# Patient Record
Sex: Female | Born: 1950 | Race: White | Hispanic: No | Marital: Single
Health system: Southern US, Community
[De-identification: ages and names within clinical notes are randomized; demographics above are authoritative.]

## PROBLEM LIST (undated history)

## (undated) DIAGNOSIS — I10 Essential (primary) hypertension: Secondary | ICD-10-CM

## (undated) DIAGNOSIS — J9611 Chronic respiratory failure with hypoxia: Secondary | ICD-10-CM

## (undated) DIAGNOSIS — G4733 Obstructive sleep apnea (adult) (pediatric): Secondary | ICD-10-CM

## (undated) DIAGNOSIS — K279 Peptic ulcer, site unspecified, unspecified as acute or chronic, without hemorrhage or perforation: Secondary | ICD-10-CM

## (undated) DIAGNOSIS — Z7409 Other reduced mobility: Secondary | ICD-10-CM

## (undated) DIAGNOSIS — D126 Benign neoplasm of colon, unspecified: Secondary | ICD-10-CM

## (undated) DIAGNOSIS — J45909 Unspecified asthma, uncomplicated: Secondary | ICD-10-CM

## (undated) DIAGNOSIS — I639 Cerebral infarction, unspecified: Secondary | ICD-10-CM

## (undated) DIAGNOSIS — M5416 Radiculopathy, lumbar region: Secondary | ICD-10-CM

## (undated) DIAGNOSIS — G629 Polyneuropathy, unspecified: Secondary | ICD-10-CM

## (undated) DIAGNOSIS — F331 Major depressive disorder, recurrent, moderate: Secondary | ICD-10-CM

## (undated) DIAGNOSIS — I878 Other specified disorders of veins: Secondary | ICD-10-CM

## (undated) DIAGNOSIS — R0609 Other forms of dyspnea: Secondary | ICD-10-CM

## (undated) DIAGNOSIS — L4050 Arthropathic psoriasis, unspecified: Secondary | ICD-10-CM

## (undated) DIAGNOSIS — R9431 Abnormal electrocardiogram [ECG] [EKG]: Secondary | ICD-10-CM

## (undated) DIAGNOSIS — I2510 Atherosclerotic heart disease of native coronary artery without angina pectoris: Secondary | ICD-10-CM

## (undated) DIAGNOSIS — R319 Hematuria, unspecified: Secondary | ICD-10-CM

## (undated) DIAGNOSIS — M87059 Idiopathic aseptic necrosis of unspecified femur: Secondary | ICD-10-CM

## (undated) DIAGNOSIS — F411 Generalized anxiety disorder: Secondary | ICD-10-CM

## (undated) DIAGNOSIS — M797 Fibromyalgia: Secondary | ICD-10-CM

## (undated) DIAGNOSIS — K5790 Diverticulosis of intestine, part unspecified, without perforation or abscess without bleeding: Secondary | ICD-10-CM

## (undated) DIAGNOSIS — H409 Unspecified glaucoma: Secondary | ICD-10-CM

## (undated) DIAGNOSIS — R0600 Dyspnea, unspecified: Secondary | ICD-10-CM

## (undated) DIAGNOSIS — J449 Chronic obstructive pulmonary disease, unspecified: Secondary | ICD-10-CM

## (undated) DIAGNOSIS — E119 Type 2 diabetes mellitus without complications: Secondary | ICD-10-CM

## (undated) HISTORY — PX: EXPLORATORY LAPAROTOMY: SUR591

---

## 2017-09-04 HISTORY — PX: TOTAL KNEE ARTHROPLASTY: SHX125

## 2018-10-05 DIAGNOSIS — R042 Hemoptysis: Secondary | ICD-10-CM

## 2018-10-05 HISTORY — DX: Hemoptysis: R04.2

## 2019-05-06 DIAGNOSIS — U071 COVID-19: Secondary | ICD-10-CM

## 2019-05-06 DIAGNOSIS — A4189 Other specified sepsis: Secondary | ICD-10-CM

## 2019-05-06 HISTORY — DX: Other specified sepsis: A41.89

## 2019-05-06 HISTORY — DX: COVID-19: U07.1

## 2019-05-31 DIAGNOSIS — U071 COVID-19: Secondary | ICD-10-CM

## 2019-05-31 HISTORY — DX: COVID-19: U07.1

## 2019-07-03 ENCOUNTER — Inpatient Hospital Stay
Admission: RE | Admit: 2019-07-03 | Discharge: 2019-07-22 | Disposition: A | Discharge status: Rehabilitation Facility | Payer: Medicare Other | Source: Other Acute Inpatient Hospital | Attending: Internal Medicine | Admitting: Internal Medicine

## 2019-07-03 ENCOUNTER — Other Ambulatory Visit (HOSPITAL_COMMUNITY): Payer: Medicare Other

## 2019-07-03 DIAGNOSIS — D729 Disorder of white blood cells, unspecified: Secondary | ICD-10-CM

## 2019-07-03 DIAGNOSIS — T85598A Other mechanical complication of other gastrointestinal prosthetic devices, implants and grafts, initial encounter: Secondary | ICD-10-CM

## 2019-07-03 DIAGNOSIS — J9690 Respiratory failure, unspecified, unspecified whether with hypoxia or hypercapnia: Secondary | ICD-10-CM

## 2019-07-03 DIAGNOSIS — J189 Pneumonia, unspecified organism: Secondary | ICD-10-CM

## 2019-07-03 DIAGNOSIS — U071 COVID-19: Secondary | ICD-10-CM

## 2019-07-03 HISTORY — DX: Dyspnea, unspecified: R06.00

## 2019-07-03 HISTORY — DX: Obstructive sleep apnea (adult) (pediatric): G47.33

## 2019-07-03 HISTORY — DX: Fibromyalgia: M79.7

## 2019-07-03 HISTORY — DX: Cerebral infarction, unspecified: I63.9

## 2019-07-03 HISTORY — DX: Other reduced mobility: Z74.09

## 2019-07-03 HISTORY — DX: Major depressive disorder, recurrent, moderate: F33.1

## 2019-07-03 HISTORY — DX: Other specified disorders of veins: I87.8

## 2019-07-03 HISTORY — DX: Arthropathic psoriasis, unspecified: L40.50

## 2019-07-03 HISTORY — DX: Essential (primary) hypertension: I10

## 2019-07-03 HISTORY — DX: Abnormal electrocardiogram (ECG) (EKG): R94.31

## 2019-07-03 HISTORY — DX: Unspecified glaucoma: H40.9

## 2019-07-03 HISTORY — DX: Type 2 diabetes mellitus without complications: E11.9

## 2019-07-03 HISTORY — DX: Polyneuropathy, unspecified: G62.9

## 2019-07-03 HISTORY — DX: Unspecified asthma, uncomplicated: J45.909

## 2019-07-03 HISTORY — DX: Diverticulosis of intestine, part unspecified, without perforation or abscess without bleeding: K57.90

## 2019-07-03 HISTORY — DX: Generalized anxiety disorder: F41.1

## 2019-07-03 HISTORY — DX: Benign neoplasm of colon, unspecified: D12.6

## 2019-07-03 HISTORY — DX: Other forms of dyspnea: R06.09

## 2019-07-03 HISTORY — DX: Idiopathic aseptic necrosis of unspecified femur: M87.059

## 2019-07-03 HISTORY — DX: Hematuria, unspecified: R31.9

## 2019-07-03 HISTORY — DX: Peptic ulcer, site unspecified, unspecified as acute or chronic, without hemorrhage or perforation: K27.9

## 2019-07-03 HISTORY — DX: Atherosclerotic heart disease of native coronary artery without angina pectoris: I25.10

## 2019-07-03 HISTORY — DX: Chronic obstructive pulmonary disease, unspecified: J44.9

## 2019-07-03 HISTORY — DX: Chronic respiratory failure with hypoxia: J96.11

## 2019-07-03 HISTORY — DX: Radiculopathy, lumbar region: M54.16

## 2019-07-04 ENCOUNTER — Other Ambulatory Visit (HOSPITAL_COMMUNITY): Payer: Medicare Other

## 2019-07-04 LAB — CBC
HCT: 42.1 % (ref 36.0–46.0)
Hemoglobin: 12.4 g/dL (ref 12.0–15.0)
MCH: 29 pg (ref 26.0–34.0)
MCHC: 29.5 g/dL — ABNORMAL LOW (ref 30.0–36.0)
MCV: 98.4 fL (ref 80.0–100.0)
Platelets: 300 10*3/uL (ref 150–400)
RBC: 4.28 MIL/uL (ref 3.87–5.11)
RDW: 14.6 % (ref 11.5–15.5)
WBC: 12.2 10*3/uL — ABNORMAL HIGH (ref 4.0–10.5)
nRBC: 0 % (ref 0.0–0.2)

## 2019-07-04 LAB — COMPREHENSIVE METABOLIC PANEL
ALT: 54 U/L — ABNORMAL HIGH (ref 0–44)
AST: 27 U/L (ref 15–41)
Albumin: 2.4 g/dL — ABNORMAL LOW (ref 3.5–5.0)
Alkaline Phosphatase: 97 U/L (ref 38–126)
Anion gap: 12 (ref 5–15)
BUN: 22 mg/dL (ref 8–23)
CO2: 26 mmol/L (ref 22–32)
Calcium: 8.7 mg/dL — ABNORMAL LOW (ref 8.9–10.3)
Chloride: 110 mmol/L (ref 98–111)
Creatinine, Ser: 0.55 mg/dL (ref 0.44–1.00)
GFR calc Af Amer: >60 mL/min (ref >60)
GFR calc non Af Amer: >60 mL/min (ref >60)
Glucose, Bld: 192 mg/dL — ABNORMAL HIGH (ref 70–99)
Potassium: 3.6 mmol/L (ref 3.5–5.1)
Sodium: 148 mmol/L — ABNORMAL HIGH (ref 135–145)
Total Bilirubin: 0.2 mg/dL — ABNORMAL LOW (ref 0.3–1.2)
Total Protein: 6.8 g/dL (ref 6.5–8.1)

## 2019-07-04 LAB — PROTIME-INR
INR: 1.1 (ref 0.8–1.2)
Prothrombin Time: 14.5 seconds (ref 11.4–15.2)

## 2019-07-04 LAB — APTT: aPTT: 28 seconds (ref 24–36)

## 2019-07-04 MED ORDER — GUAIFENESIN 100 MG/5ML PO LIQD
200.00 | ORAL | Status: DC
Start: ? — End: 2019-07-04

## 2019-07-04 MED ORDER — BENZONATATE 100 MG PO CAPS
100.00 | ORAL_CAPSULE | ORAL | Status: DC
Start: ? — End: 2019-07-04

## 2019-07-04 MED ORDER — DEXTROSE 10 % IV SOLN
125.00 | INTRAVENOUS | Status: DC
Start: ? — End: 2019-07-04

## 2019-07-04 MED ORDER — OXYCODONE HCL 5 MG PO TABS
5.00 | ORAL_TABLET | ORAL | Status: DC
Start: 2019-07-04 — End: 2019-07-04

## 2019-07-04 MED ORDER — GENERIC EXTERNAL MEDICATION
40.00 | Status: DC
Start: 2019-07-04 — End: 2019-07-04

## 2019-07-04 MED ORDER — ACETAMINOPHEN 325 MG PO TABS
650.00 | ORAL_TABLET | ORAL | Status: DC
Start: ? — End: 2019-07-04

## 2019-07-04 MED ORDER — PRAVASTATIN SODIUM 10 MG PO TABS
20.00 | ORAL_TABLET | ORAL | Status: DC
Start: 2019-07-04 — End: 2019-07-04

## 2019-07-04 MED ORDER — GENERIC EXTERNAL MEDICATION
2.00 | Status: DC
Start: 2019-07-04 — End: 2019-07-04

## 2019-07-04 MED ORDER — INSULIN LISPRO 100 UNIT/ML ~~LOC~~ SOLN
2.00 | SUBCUTANEOUS | Status: DC
Start: 2019-07-04 — End: 2019-07-04

## 2019-07-04 MED ORDER — ALBUTEROL SULFATE HFA 108 (90 BASE) MCG/ACT IN AERS
2.00 | INHALATION_SPRAY | RESPIRATORY_TRACT | Status: DC
Start: ? — End: 2019-07-04

## 2019-07-04 MED ORDER — OXYCODONE HCL 5 MG PO TABS
10.00 | ORAL_TABLET | ORAL | Status: DC
Start: ? — End: 2019-07-04

## 2019-07-04 MED ORDER — GABAPENTIN 300 MG PO CAPS
300.00 | ORAL_CAPSULE | ORAL | Status: DC
Start: 2019-07-04 — End: 2019-07-04

## 2019-07-04 MED ORDER — GENERIC EXTERNAL MEDICATION
Status: DC
Start: 2019-07-04 — End: 2019-07-04

## 2019-07-04 MED ORDER — ALPRAZOLAM 0.25 MG PO TABS
0.25 | ORAL_TABLET | ORAL | Status: DC
Start: 2019-07-04 — End: 2019-07-04

## 2019-07-04 MED ORDER — GENERIC EXTERNAL MEDICATION
Status: DC
Start: ? — End: 2019-07-04

## 2019-07-04 MED ORDER — ENOXAPARIN SODIUM 60 MG/0.6ML ~~LOC~~ SOLN
60.00 | SUBCUTANEOUS | Status: DC
Start: 2019-07-04 — End: 2019-07-04

## 2019-07-04 MED ORDER — SALINE NASAL SPRAY 0.65 % NA SOLN
1.00 | NASAL | Status: DC
Start: ? — End: 2019-07-04

## 2019-07-04 MED ORDER — IPRATROPIUM-ALBUTEROL 0.5-2.5 (3) MG/3ML IN SOLN
3.00 | RESPIRATORY_TRACT | Status: DC
Start: 2019-07-04 — End: 2019-07-04

## 2019-07-04 MED ORDER — OXYCODONE HCL 5 MG PO TABS
5.00 | ORAL_TABLET | ORAL | Status: DC
Start: ? — End: 2019-07-04

## 2019-07-04 MED ORDER — GENERIC EXTERNAL MEDICATION
625.00 | Status: DC
Start: 2019-07-04 — End: 2019-07-04

## 2019-07-04 MED ORDER — INSULIN GLARGINE 100 UNIT/ML SOLOSTAR PEN
30.00 | PEN_INJECTOR | SUBCUTANEOUS | Status: DC
Start: 2019-07-04 — End: 2019-07-04

## 2019-07-04 MED ORDER — NYSTATIN 100000 UNIT/GM EX POWD
CUTANEOUS | Status: DC
Start: 2019-07-04 — End: 2019-07-04

## 2019-07-04 MED ORDER — LORAZEPAM 2 MG/ML IJ SOLN
0.50 | INTRAMUSCULAR | Status: DC
Start: ? — End: 2019-07-04

## 2019-07-04 MED ORDER — GLUCOSE 40 % PO GEL
15.00 | ORAL | Status: DC
Start: ? — End: 2019-07-04

## 2019-07-04 MED ORDER — QUETIAPINE FUMARATE 25 MG PO TABS
50.00 | ORAL_TABLET | ORAL | Status: DC
Start: ? — End: 2019-07-04

## 2019-07-04 MED ORDER — LOPERAMIDE HCL 2 MG PO CAPS
2.00 | ORAL_CAPSULE | ORAL | Status: DC
Start: ? — End: 2019-07-04

## 2019-07-05 ENCOUNTER — Other Ambulatory Visit (HOSPITAL_COMMUNITY): Payer: Medicare Other

## 2019-07-06 LAB — BASIC METABOLIC PANEL
Anion gap: 12 (ref 5–15)
BUN: 23 mg/dL (ref 8–23)
CO2: 29 mmol/L (ref 22–32)
Calcium: 9.1 mg/dL (ref 8.9–10.3)
Chloride: 110 mmol/L (ref 98–111)
Creatinine, Ser: 0.59 mg/dL (ref 0.44–1.00)
GFR calc Af Amer: >60 mL/min (ref >60)
GFR calc non Af Amer: >60 mL/min (ref >60)
Glucose, Bld: 230 mg/dL — ABNORMAL HIGH (ref 70–99)
Potassium: 3.8 mmol/L (ref 3.5–5.1)
Sodium: 151 mmol/L — ABNORMAL HIGH (ref 135–145)

## 2019-07-06 LAB — CBC
HCT: 44.8 % (ref 36.0–46.0)
Hemoglobin: 13.2 g/dL (ref 12.0–15.0)
MCH: 28.6 pg (ref 26.0–34.0)
MCHC: 29.5 g/dL — ABNORMAL LOW (ref 30.0–36.0)
MCV: 97.2 fL (ref 80.0–100.0)
Platelets: 303 10*3/uL (ref 150–400)
RBC: 4.61 MIL/uL (ref 3.87–5.11)
RDW: 14.3 % (ref 11.5–15.5)
WBC: 14.1 10*3/uL — ABNORMAL HIGH (ref 4.0–10.5)
nRBC: 0 % (ref 0.0–0.2)

## 2019-07-08 LAB — URINALYSIS, ROUTINE W REFLEX MICROSCOPIC
Bilirubin Urine: NEGATIVE
Glucose, UA: NEGATIVE mg/dL
Hgb urine dipstick: NEGATIVE
Ketones, ur: NEGATIVE mg/dL
Nitrite: NEGATIVE
Protein, ur: NEGATIVE mg/dL
Specific Gravity, Urine: 1.021 (ref 1.005–1.030)
pH: 5 (ref 5.0–8.0)

## 2019-07-08 LAB — CBC
HCT: 44.3 % (ref 36.0–46.0)
Hemoglobin: 13.1 g/dL (ref 12.0–15.0)
MCH: 28.7 pg (ref 26.0–34.0)
MCHC: 29.6 g/dL — ABNORMAL LOW (ref 30.0–36.0)
MCV: 96.9 fL (ref 80.0–100.0)
Platelets: 240 10*3/uL (ref 150–400)
RBC: 4.57 MIL/uL (ref 3.87–5.11)
RDW: 14.3 % (ref 11.5–15.5)
WBC: 17.1 10*3/uL — ABNORMAL HIGH (ref 4.0–10.5)
nRBC: 0 % (ref 0.0–0.2)

## 2019-07-08 LAB — BASIC METABOLIC PANEL
Anion gap: 10 (ref 5–15)
BUN: 30 mg/dL — ABNORMAL HIGH (ref 8–23)
CO2: 27 mmol/L (ref 22–32)
Calcium: 9 mg/dL (ref 8.9–10.3)
Chloride: 111 mmol/L (ref 98–111)
Creatinine, Ser: 0.65 mg/dL (ref 0.44–1.00)
GFR calc Af Amer: >60 mL/min (ref >60)
GFR calc non Af Amer: >60 mL/min (ref >60)
Glucose, Bld: 215 mg/dL — ABNORMAL HIGH (ref 70–99)
Potassium: 4.3 mmol/L (ref 3.5–5.1)
Sodium: 148 mmol/L — ABNORMAL HIGH (ref 135–145)

## 2019-07-09 LAB — URINE CULTURE

## 2019-07-09 LAB — BASIC METABOLIC PANEL
Anion gap: 12 (ref 5–15)
BUN: 27 mg/dL — ABNORMAL HIGH (ref 8–23)
CO2: 25 mmol/L (ref 22–32)
Calcium: 8.5 mg/dL — ABNORMAL LOW (ref 8.9–10.3)
Chloride: 103 mmol/L (ref 98–111)
Creatinine, Ser: 0.52 mg/dL (ref 0.44–1.00)
GFR calc Af Amer: >60 mL/min (ref >60)
GFR calc non Af Amer: >60 mL/min (ref >60)
Glucose, Bld: 147 mg/dL — ABNORMAL HIGH (ref 70–99)
Potassium: 4.2 mmol/L (ref 3.5–5.1)
Sodium: 140 mmol/L (ref 135–145)

## 2019-07-09 LAB — CBC
HCT: 41.8 % (ref 36.0–46.0)
Hemoglobin: 12.7 g/dL (ref 12.0–15.0)
MCH: 28.6 pg (ref 26.0–34.0)
MCHC: 30.4 g/dL (ref 30.0–36.0)
MCV: 94.1 fL (ref 80.0–100.0)
Platelets: 199 10*3/uL (ref 150–400)
RBC: 4.44 MIL/uL (ref 3.87–5.11)
RDW: 13.7 % (ref 11.5–15.5)
WBC: 20.4 10*3/uL — ABNORMAL HIGH (ref 4.0–10.5)
nRBC: 0 % (ref 0.0–0.2)

## 2019-07-10 LAB — CULTURE, RESPIRATORY W GRAM STAIN: Culture: NORMAL

## 2019-07-10 LAB — VANCOMYCIN, TROUGH: Vancomycin Tr: 12 ug/mL — ABNORMAL LOW (ref 15–20)

## 2019-07-11 ENCOUNTER — Other Ambulatory Visit (HOSPITAL_COMMUNITY): Payer: Medicare Other

## 2019-07-11 LAB — VANCOMYCIN, TROUGH: Vancomycin Tr: 21 ug/mL (ref 15–20)

## 2019-07-13 LAB — BASIC METABOLIC PANEL
Anion gap: 11 (ref 5–15)
BUN: 18 mg/dL (ref 8–23)
CO2: 24 mmol/L (ref 22–32)
Calcium: 8.5 mg/dL — ABNORMAL LOW (ref 8.9–10.3)
Chloride: 103 mmol/L (ref 98–111)
Creatinine, Ser: 0.44 mg/dL (ref 0.44–1.00)
GFR calc Af Amer: >60 mL/min (ref >60)
GFR calc non Af Amer: >60 mL/min (ref >60)
Glucose, Bld: 205 mg/dL — ABNORMAL HIGH (ref 70–99)
Potassium: 3.6 mmol/L (ref 3.5–5.1)
Sodium: 138 mmol/L (ref 135–145)

## 2019-07-13 LAB — VANCOMYCIN, TROUGH: Vancomycin Tr: 19 ug/mL (ref 15–20)

## 2019-07-17 ENCOUNTER — Other Ambulatory Visit (HOSPITAL_COMMUNITY): Payer: Medicare Other

## 2019-07-17 LAB — CBC
HCT: 39.4 % (ref 36.0–46.0)
Hemoglobin: 12.6 g/dL (ref 12.0–15.0)
MCH: 28.7 pg (ref 26.0–34.0)
MCHC: 32 g/dL (ref 30.0–36.0)
MCV: 89.7 fL (ref 80.0–100.0)
Platelets: 168 10*3/uL (ref 150–400)
RBC: 4.39 MIL/uL (ref 3.87–5.11)
RDW: 14.6 % (ref 11.5–15.5)
WBC: 21.3 10*3/uL — ABNORMAL HIGH (ref 4.0–10.5)
nRBC: 0 % (ref 0.0–0.2)

## 2019-07-17 LAB — BASIC METABOLIC PANEL
Anion gap: 14 (ref 5–15)
BUN: 14 mg/dL (ref 8–23)
CO2: 24 mmol/L (ref 22–32)
Calcium: 8.6 mg/dL — ABNORMAL LOW (ref 8.9–10.3)
Chloride: 101 mmol/L (ref 98–111)
Creatinine, Ser: 0.58 mg/dL (ref 0.44–1.00)
GFR calc Af Amer: >60 mL/min (ref >60)
GFR calc non Af Amer: >60 mL/min (ref >60)
Glucose, Bld: 245 mg/dL — ABNORMAL HIGH (ref 70–99)
Potassium: 3.7 mmol/L (ref 3.5–5.1)
Sodium: 139 mmol/L (ref 135–145)

## 2019-07-17 NOTE — Progress Notes (Signed)
  Echocardiogram 2D Echocardiogram has been performed.  Vanessa Mack 07/17/2019, 9:52 AM

## 2019-07-18 LAB — BASIC METABOLIC PANEL
Anion gap: 14 (ref 5–15)
BUN: 9 mg/dL (ref 8–23)
CO2: 24 mmol/L (ref 22–32)
Calcium: 8.1 mg/dL — ABNORMAL LOW (ref 8.9–10.3)
Chloride: 104 mmol/L (ref 98–111)
Creatinine, Ser: 0.4 mg/dL — ABNORMAL LOW (ref 0.44–1.00)
GFR calc Af Amer: >60 mL/min (ref >60)
GFR calc non Af Amer: >60 mL/min (ref >60)
Glucose, Bld: 117 mg/dL — ABNORMAL HIGH (ref 70–99)
Potassium: 2.8 mmol/L — ABNORMAL LOW (ref 3.5–5.1)
Sodium: 142 mmol/L (ref 135–145)

## 2019-07-18 LAB — CBC
HCT: 38.6 % (ref 36.0–46.0)
Hemoglobin: 12.4 g/dL (ref 12.0–15.0)
MCH: 29 pg (ref 26.0–34.0)
MCHC: 32.1 g/dL (ref 30.0–36.0)
MCV: 90.4 fL (ref 80.0–100.0)
Platelets: 150 10*3/uL (ref 150–400)
RBC: 4.27 MIL/uL (ref 3.87–5.11)
RDW: 14.8 % (ref 11.5–15.5)
WBC: 18.5 10*3/uL — ABNORMAL HIGH (ref 4.0–10.5)
nRBC: 0 % (ref 0.0–0.2)

## 2019-07-19 ENCOUNTER — Encounter: Payer: Self-pay | Admitting: Occupational Therapy

## 2019-07-19 LAB — POTASSIUM: Potassium: 3.8 mmol/L (ref 3.5–5.1)

## 2019-07-19 NOTE — PMR Pre-admission (Signed)
PMR Admission Coordinator Pre-Admission Assessment  Patient: Vanessa Mack is an 68 y.o., female MRN: [id number] DOB: 1951-06-04 Height: _0  (1.702 m) Weight: 96.2 kg   Insurance Information HMO:     PPO:      PCP:      IPA:      80/20: yes     OTHER:   PRIMARY: Medicare Part A and B      Policy#: [id number]      Subscriber: Patient CM Name:       Phone#:      Fax#:  Pre-Cert#:       Employer:  Benefits:  Phone #: online     Name: verified eligibility via [place] on 07/22/2019 Eff. Date: Part A and B effective 06/04/2008     Deduct: $1,408      Out of Pocket Max: NA      Life Max: NA CIR: Covered per Medicare guidelines once yearly deductible has been met.      SNF: days 1-20, 100%, days 21-100, 80% Outpatient: 80%     Co-Pay: 20% Home Health: 100%      Co-Pay:  DME: 80%     Co-Pay: 20% Providers: Pt's choice  *Pt is a QMB **According to [facility], on day of admit to CIR (11/17) pt will have 8 full Medicare days remaining. Anticipate pt will begin using her co-pay days while in IP Rehab. Pt and family aware that she will be using her co-pay days ($352/day once full days are used up).   SECONDARY: Medicaid [place]      Policy#: [id number]      Subscriber: Patient Coverage Code: [id number] CM Name:       Phone#:      Fax#:  Pre-Cert#:       Employer:  Benefits:  Phone #: [phone]     Name: automated system Eff. Date: verified eligibility on 07/22/2019     Deduct:       Out of Pocket Max:       Life Max:  CIR:       SNF:  Outpatient:      Co-Pay:  Home Health:       Co-Pay:  DME:      Co-Pay:   Medicaid Application Date:       Case Manager:  Disability Application Date:       Case Worker:   The "Data Collection Information Summary" for patients in Inpatient Rehabilitation Facilities with attached "Privacy Act [place] Records" was provided and verbally reviewed with: Patient  Emergency Contact Information Contact Information    Name Relation Home  Work [place], Vanessa Mack Son [phone]     *[name] (primary contact) Son [phone]        Current Medical History  Patient Admitting Diagnosis: Debility after Covid-19 respiratory failure  History of Present Illness: Pt is a 68 yo Female with a past medical history significant for diabetes, CAD, obstructive sleep apnea, hyperlipidemia, CVA, and COPD. Pt uses 2L [place] at home. Pt initially presented to [facility] after 1 week of malaise, DOE/SOB, and cough. Pt had recently been exposed to B and E from her [facility] aide who was positive for the virus. At [facility], pt required 6L [place] to maintain sats, was febrile, hypotensive, and tachycardic. Pt was admitted to the ICU, found to be COVID-19 positive and transferred to to [facility] on 9/27 for further management. Pt developed worsening hypoxia from COVID PNA and  required intubation on 9/29. Pt completed a 10 day course of remdesivir/dexamethasone and intermittently diuresed. Pt required increased vent support due to desaturations and required a 10 day course of vancoymycin/cefepime. Pt was given a poor prognosis and goals of care discussion occurred with family. Thankfully, pt's vent requirements gradually improved and pt was able to be extubated on 10/26. She was able to be weaned to 2L and sent to [facility] on rocephin due to E coli HAP. At [facility], pt progressed to room air and diet was able to be upgraded to carb modified, thin liquids. Pt's confusion has improved but remains with significant weakness at this time. Pt has been assessed by therapies with recommendation for CIR due to debility. Pt is to admit to CIR on 07/22/2019.   Patient's medical record from [facility] has been reviewed by the rehabilitation admission coordinator and physician.  Past Medical History  Past Medical History:  Diagnosis Date  . Aseptic necrosis of femur ([place])   . Chronic respiratory failure with hypoxia ([place])   .  Colon adenomas   . COPD (chronic obstructive pulmonary disease) ([place])   . Cough with hemoptysis 10/2018  . COVID-19 05/31/2019  . Diverticulosis   . DOE (dyspnea on exertion)   . Fibromyalgia   . GAD (generalized anxiety disorder)   . Glaucoma   . Hematuria   . HTN (hypertension)   . Impaired functional mobility, balance, gait, and endurance   . Intrinsic asthma   . Lumbar radiculopathy   . Major depressive disorder, recurrent episode, moderate ([place])   . Nonobstructive atherosclerosis of coronary artery   . OSA (obstructive sleep apnea)   . Polyneuropathy   . Prolonged Q-T interval on ECG   . Psoriatic arthritis ([place])   . PUD (peptic ulcer disease)   . Sepsis due to COVID-19 ([place]) 05/2019  . Stroke (cerebrum) ([place])   . T2DM (type 2 diabetes mellitus) ([place])   . Venous stasis of lower extremity     Family History   family history includes Clotting disorder in her mother; Diabetes in her mother; Psoriasis in her father; Skin cancer in her mother.  Prior Rehab/Hospitalizations Has the patient had prior rehab or hospitalizations prior to admission? Yes  Has the patient had major surgery during 100 days prior to admission? No   Current Medications No current outpatient medications on file.   See MAR   Patients Current Diet:  Diet Order    None      Precautions / Restrictions Monitor HR, Spo2 with activity      Has the patient had 2 or more falls or a fall with injury in the past year? No  Prior Activity Level Pt reports independent PTA but has an aide M-F that would assist with cooking, cleaning, and sometimes supervise her ADLs when she needed it. Pt utilized a 4pt cane for ambulation and was Independent in her medication management.     Prior Functional Level Self Care: Did the patient need help bathing, dressing, using the toilet or eating? Independent  Indoor Mobility: Did the patient need assistance with walking from room to room (with or without device)?  Independent  Stairs: Did the patient need assistance with internal or external stairs (with or without device)? Needed some help  Functional Cognition: Did the patient need help planning regular tasks such as shopping or remembering to take medications? Independent  Home Assistive Devices / Equipment    Prior Device Use: Indicate devices/aids used by the patient  prior to current illness, exacerbation or injury? 4 pronged cane   Prior Functional Level Current Functional Level  Bed Mobility  Independent  Min A   Transfers  Mod Independent   Mod A sit to stand   Mobility - Walk/Wheelchair  Mod Independent   NT   Upper Body Dressing  Independent   Min A   Lower Body Dressing  Independent   Dependent   Grooming  Independent   set up A   Eating/Drinking  Independent   set up A   Toilet Transfer  Independent   NT   Bladder Continence   continent   continent   Bowel Management  continent   continent   Stair Climbing      Dependent   Communication  WNL   WNL   Memory  WNL  WNL per this AC assessment   Driving        Special needs/care consideration BiPAP/CPAP : has used CPAP in past but has been without it >1 yr now at this time.  CPM : no Continuous Drip IV : none Dialysis : no        Days : no Life Vest : no Oxygen : on RA Special Bed : air overlay  Trach Size : no Wound Vac (area) : no      Location : no Skin: Per chart: unstageable sacrococcyx pressure injury, unstagable L ear abrasion, right heel stage 1, right and left anterior foot hyperlaurotic skin rash, anterior chest abdominal abrasions.             Bowel mgmt: last BM 11/15 Bladder mgmt: continent Diabetic mgmt: yes Behavioral consideration : no Chemo/radiation : pt reports being on medication for psoriasis that is a maintenance medication    Previous Home Environment  Pt lived alone in an apartment. She lives on the ground floor with level entry. She used a walk in shower that had  a seat, an elevated commode, and a bathroom that was accessible via RW. Pt has rails throughout her apartment and emergency call buttons. Pt utlized supplemental O2 at home (2-3 L day/night).  Pt has an aide who provided supervision during ADLs at times when she was weak as well as IADL support (cooking, cleaning). Aide was utilized M-F 3 hr/day.     Discharge Living Setting Plan is to DC to her son's house Vanessa Mack). He lives in a one story home with 3 steps to enter with bilateral rails. Pt has options of using tub/shower or walk in shower with standard commode and a bathroom that is assessable vis RW.  Pt does not have trouble obtaining medications at DC as her son's drive.     Social/Family/Support Systems Pt has very supportive sons (primary contact: Vanessa Mack [phone]) (secondary contact: Vanessa Mack: [phone]) Plan is to DC to Vanessa Mack's house where he is available 24/7 and can do up to Min/Mod A; he works from home.  Discharge plan has been discussed with pt and Vanessa Mack and Training and development officer (both sons) are in agreement with the plan.  Caregiver does not have issues with lodging/transportation while pt is in rehab.     Goals/Additional Needs Patient/family Goal for Rehab: PT/OT: Supervision; SLP: NA Expected length of stay: 12-16 days Cultural considerations: NA Dietary Needs: carb modified, regular diet, thin liquids Equipment needs: TBD Pt and family agree to admission and willing to participate: yes Program orientation provided and reviewed with pt and her sons Barriers to DC: home environment (Stairs to enter son's house). Cost  of care-pt will most likely enter her Medicare co-payment days.      Decrease burden of Care through IP rehab admission: NA  Possible need for SNF placement upon discharge: Not anticipated; pt and her family are willing and able to assist her at home after IP rehab. Pt has good prognosis for further progress through IP rehab. Anticipate pt will be able to  make measurable gains in a reasonable amount of time through CIR without need for SNF.   Patient Condition: I have reviewed medical records from [facility], spoken with CM, and patient and son. I met with patient at the bedside for inpatient rehabilitation assessment.  Patient will benefit from ongoing PT and OT, can actively participate in 3 hours of therapy a day 5 days of the week, and can make measurable gains during the admission.  Patient will also benefit from the coordinated team approach during an Inpatient Acute Rehabilitation admission.  The patient will receive intensive therapy as well as Rehabilitation physician, nursing, social worker, and care management interventions.  Due to safety, skin/wound care, disease management, medication administration, pain management and patient education the patient requires 24 hour a day rehabilitation nursing.  The patient is currently Mod A to stand, no gait attempted at this time and Min A to dependent for basic ADLs.  Discharge setting and therapy post discharge at home with home health is anticipated.  Patient has agreed to participate in the Acute Inpatient Rehabilitation Program and will admit 07/22/2019.  Preadmission Screen Completed By:  Raechel Ache, 07/22/2019 12:24 PM ______________________________________________________________________   Discussed status with Dr. Naaman Plummer on 07/22/2019 at 12:17PM and received approval for admission today.  Admission Coordinator:  Raechel Ache, OT, time 12:21PM/Date 07/22/2019   Assessment/Plan: Diagnosis: debility after COVID respiratory failure 1. Does the need for close, 24 hr/day Medical supervision in concert with the patient's rehab needs make it unreasonable for this patient to be served in a less intensive setting? Yes 2. Co-Morbidities requiring supervision/potential complications: DM, CAD, OSA, COPD, prior CVA 3. Due to bladder management, bowel management, safety, skin/wound care,  disease management, medication administration, pain management and patient education, does the patient require 24 hr/day rehab nursing? Yes 4. Does the patient require coordinated care of a physician, rehab nurse, PT, OT, and SLP to address physical and functional deficits in the context of the above medical diagnosis(es)? Yes Addressing deficits in the following areas: balance, endurance, locomotion, strength, transferring, bowel/bladder control, bathing, dressing, feeding, grooming, toileting and psychosocial support 5. Can the patient actively participate in an intensive therapy program of at least 3 hrs of therapy 5 days a week? Yes 6. The potential for patient to make measurable gains while on inpatient rehab is excellent 7. Anticipated functional outcomes upon discharge from inpatient rehab: supervision PT, supervision OT, n/a SLP 8. Estimated rehab length of stay to reach the above functional goals is: 13-17 days 9. Anticipated discharge destination: Home 10. Overall Rehab/Functional Prognosis: excellent  MD Signature Meredith Staggers, MD, [facility] 07/22/2019

## 2019-07-21 ENCOUNTER — Encounter: Payer: Self-pay | Admitting: Physical Medicine and Rehabilitation

## 2019-07-21 ENCOUNTER — Other Ambulatory Visit (HOSPITAL_COMMUNITY): Payer: Medicare Other

## 2019-07-22 ENCOUNTER — Other Ambulatory Visit: Payer: Self-pay

## 2019-07-22 ENCOUNTER — Encounter (HOSPITAL_COMMUNITY): Payer: Self-pay

## 2019-07-22 ENCOUNTER — Inpatient Hospital Stay (HOSPITAL_COMMUNITY)
Admission: RE | Admit: 2019-07-22 | Discharge: 2019-08-15 | DRG: 868 | Disposition: A | Discharge status: Home Health | Payer: Medicare Other | Source: Other Acute Inpatient Hospital | Attending: Physical Medicine & Rehabilitation | Admitting: Physical Medicine & Rehabilitation

## 2019-07-22 DIAGNOSIS — M797 Fibromyalgia: Secondary | ICD-10-CM | POA: Diagnosis present

## 2019-07-22 DIAGNOSIS — R5381 Other malaise: Secondary | ICD-10-CM | POA: Diagnosis present

## 2019-07-22 DIAGNOSIS — J449 Chronic obstructive pulmonary disease, unspecified: Secondary | ICD-10-CM | POA: Diagnosis present

## 2019-07-22 DIAGNOSIS — K629 Disease of anus and rectum, unspecified: Secondary | ICD-10-CM | POA: Diagnosis present

## 2019-07-22 DIAGNOSIS — I10 Essential (primary) hypertension: Secondary | ICD-10-CM | POA: Diagnosis present

## 2019-07-22 DIAGNOSIS — Z8673 Personal history of transient ischemic attack (TIA), and cerebral infarction without residual deficits: Secondary | ICD-10-CM

## 2019-07-22 DIAGNOSIS — L4050 Arthropathic psoriasis, unspecified: Secondary | ICD-10-CM | POA: Diagnosis present

## 2019-07-22 DIAGNOSIS — E1140 Type 2 diabetes mellitus with diabetic neuropathy, unspecified: Secondary | ICD-10-CM | POA: Diagnosis present

## 2019-07-22 DIAGNOSIS — R110 Nausea: Secondary | ICD-10-CM | POA: Diagnosis present

## 2019-07-22 DIAGNOSIS — I2510 Atherosclerotic heart disease of native coronary artery without angina pectoris: Secondary | ICD-10-CM | POA: Diagnosis present

## 2019-07-22 DIAGNOSIS — E119 Type 2 diabetes mellitus without complications: Secondary | ICD-10-CM

## 2019-07-22 DIAGNOSIS — Z886 Allergy status to analgesic agent status: Secondary | ICD-10-CM

## 2019-07-22 DIAGNOSIS — G4700 Insomnia, unspecified: Secondary | ICD-10-CM | POA: Diagnosis present

## 2019-07-22 DIAGNOSIS — Z833 Family history of diabetes mellitus: Secondary | ICD-10-CM | POA: Diagnosis not present

## 2019-07-22 DIAGNOSIS — F411 Generalized anxiety disorder: Secondary | ICD-10-CM | POA: Diagnosis present

## 2019-07-22 DIAGNOSIS — E1169 Type 2 diabetes mellitus with other specified complication: Secondary | ICD-10-CM | POA: Diagnosis not present

## 2019-07-22 DIAGNOSIS — M19012 Primary osteoarthritis, left shoulder: Secondary | ICD-10-CM | POA: Diagnosis present

## 2019-07-22 DIAGNOSIS — E669 Obesity, unspecified: Secondary | ICD-10-CM | POA: Diagnosis not present

## 2019-07-22 DIAGNOSIS — J9611 Chronic respiratory failure with hypoxia: Secondary | ICD-10-CM | POA: Diagnosis present

## 2019-07-22 DIAGNOSIS — M7502 Adhesive capsulitis of left shoulder: Secondary | ICD-10-CM | POA: Diagnosis present

## 2019-07-22 DIAGNOSIS — G4733 Obstructive sleep apnea (adult) (pediatric): Secondary | ICD-10-CM | POA: Diagnosis present

## 2019-07-22 DIAGNOSIS — E876 Hypokalemia: Secondary | ICD-10-CM | POA: Diagnosis present

## 2019-07-22 DIAGNOSIS — B948 Sequelae of other specified infectious and parasitic diseases: Principal | ICD-10-CM

## 2019-07-22 DIAGNOSIS — Z23 Encounter for immunization: Secondary | ICD-10-CM | POA: Diagnosis present

## 2019-07-22 DIAGNOSIS — Z9989 Dependence on other enabling machines and devices: Secondary | ICD-10-CM

## 2019-07-22 DIAGNOSIS — D72829 Elevated white blood cell count, unspecified: Secondary | ICD-10-CM | POA: Diagnosis present

## 2019-07-22 DIAGNOSIS — Z96651 Presence of right artificial knee joint: Secondary | ICD-10-CM | POA: Diagnosis present

## 2019-07-22 DIAGNOSIS — E785 Hyperlipidemia, unspecified: Secondary | ICD-10-CM | POA: Diagnosis present

## 2019-07-22 DIAGNOSIS — Z5320 Procedure and treatment not carried out because of patient's decision for unspecified reasons: Secondary | ICD-10-CM | POA: Diagnosis not present

## 2019-07-22 DIAGNOSIS — T380X5A Adverse effect of glucocorticoids and synthetic analogues, initial encounter: Secondary | ICD-10-CM | POA: Diagnosis present

## 2019-07-22 DIAGNOSIS — U071 COVID-19: Secondary | ICD-10-CM

## 2019-07-22 DIAGNOSIS — G894 Chronic pain syndrome: Secondary | ICD-10-CM | POA: Diagnosis present

## 2019-07-22 DIAGNOSIS — G479 Sleep disorder, unspecified: Secondary | ICD-10-CM | POA: Diagnosis not present

## 2019-07-22 LAB — NOVEL CORONAVIRUS, NAA (HOSP ORDER, SEND-OUT TO REF LAB; TAT 18-24 HRS): SARS-CoV-2, NAA: NOT DETECTED

## 2019-07-22 LAB — HEMOGLOBIN A1C
Hgb A1c MFr Bld: 6.7 % — ABNORMAL HIGH (ref 4.8–5.6)
Mean Plasma Glucose: 145.59 mg/dL

## 2019-07-22 LAB — GLUCOSE, CAPILLARY
Glucose-Capillary: 207 mg/dL — ABNORMAL HIGH (ref 70–99)
Glucose-Capillary: 147 mg/dL — ABNORMAL HIGH (ref 70–99)

## 2019-07-22 MED ORDER — BISACODYL 10 MG RE SUPP: 10.0000 mg | Freq: Every day | RECTAL | Status: DC | PRN

## 2019-07-22 MED ORDER — LIDOCAINE 5 % EX PTCH: 1.0000 | MEDICATED_PATCH | CUTANEOUS | Status: DC

## 2019-07-22 MED ORDER — PREDNISONE 10 MG (21) PO TBPK
20.0000 mg | ORAL_TABLET | Freq: Every evening | ORAL | Status: AC
  Administered 2019-07-24: 20 mg via ORAL

## 2019-07-22 MED ORDER — PREDNISONE 10 MG PO TABS: 20.0000 mg | ORAL_TABLET | Freq: Every morning | ORAL | Status: DC

## 2019-07-22 MED ORDER — PREDNISONE 10 MG (21) PO TBPK
10.0000 mg | ORAL_TABLET | ORAL | Status: AC
  Administered 2019-07-23: 10 mg via ORAL

## 2019-07-22 MED ORDER — PROCHLORPERAZINE MALEATE 5 MG PO TABS
5.0000 mg | ORAL_TABLET | Freq: Four times a day (QID) | ORAL | Status: DC | PRN
  Administered 2019-07-25: 10 mg via ORAL
  Filled 2019-07-22: qty 2

## 2019-07-22 MED ORDER — PREDNISONE 10 MG (21) PO TBPK
20.0000 mg | ORAL_TABLET | Freq: Every morning | ORAL | Status: DC
  Filled 2019-07-22: qty 21

## 2019-07-22 MED ORDER — PREDNISONE 10 MG (21) PO TBPK
20.0000 mg | ORAL_TABLET | Freq: Every evening | ORAL | Status: AC
  Administered 2019-07-23: 20 mg via ORAL

## 2019-07-22 MED ORDER — INSULIN ASPART 100 UNIT/ML ~~LOC~~ SOLN
0.0000 [IU] | Freq: Every day | SUBCUTANEOUS | Status: DC
  Administered 2019-07-25 – 2019-07-26 (×2): 2 [IU] via SUBCUTANEOUS

## 2019-07-22 MED ORDER — PREDNISONE 10 MG (21) PO TBPK: 20.0000 mg | ORAL_TABLET | Freq: Every evening | ORAL | Status: DC

## 2019-07-22 MED ORDER — VITAMIN B-1 100 MG PO TABS
100.0000 mg | ORAL_TABLET | Freq: Every day | ORAL | Status: DC
  Administered 2019-07-23 – 2019-08-14 (×23): 100 mg via ORAL
  Filled 2019-07-22 (×23): qty 1

## 2019-07-22 MED ORDER — PREDNISONE 10 MG PO TABS: 20.0000 mg | ORAL_TABLET | Freq: Every evening | ORAL | Status: DC

## 2019-07-22 MED ORDER — PREDNISONE 10 MG (21) PO TBPK
20.0000 mg | ORAL_TABLET | Freq: Every morning | ORAL | Status: AC
  Administered 2019-07-23: 20 mg via ORAL
  Filled 2019-07-22: qty 21

## 2019-07-22 MED ORDER — ALUM & MAG HYDROXIDE-SIMETH 200-200-20 MG/5ML PO SUSP: 30.0000 mL | ORAL | Status: DC | PRN

## 2019-07-22 MED ORDER — FAMOTIDINE 20 MG PO TABS
20.0000 mg | ORAL_TABLET | Freq: Two times a day (BID) | ORAL | Status: DC
  Administered 2019-07-22 – 2019-08-15 (×48): 20 mg via ORAL
  Filled 2019-07-22 (×48): qty 1

## 2019-07-22 MED ORDER — PREDNISONE 10 MG (21) PO TBPK: 10.0000 mg | ORAL_TABLET | Freq: Four times a day (QID) | ORAL | Status: DC

## 2019-07-22 MED ORDER — TRAZODONE HCL 50 MG PO TABS
25.0000 mg | ORAL_TABLET | Freq: Every evening | ORAL | Status: DC | PRN
  Administered 2019-07-22: 50 mg via ORAL
  Filled 2019-07-22: qty 1

## 2019-07-22 MED ORDER — PRO-STAT SUGAR FREE PO LIQD
30.0000 mL | Freq: Two times a day (BID) | ORAL | Status: DC
  Administered 2019-07-22 – 2019-08-15 (×48): 30 mL via ORAL
  Filled 2019-07-22 (×48): qty 30

## 2019-07-22 MED ORDER — AMANTADINE HCL 100 MG PO CAPS
100.0000 mg | ORAL_CAPSULE | Freq: Every day | ORAL | Status: DC
  Administered 2019-07-23 – 2019-08-01 (×10): 100 mg via ORAL
  Filled 2019-07-22 (×10): qty 1

## 2019-07-22 MED ORDER — PREGABALIN 25 MG PO CAPS: 25.0000 mg | ORAL_CAPSULE | Freq: Every day | ORAL | Status: DC

## 2019-07-22 MED ORDER — GUAIFENESIN-DM 100-10 MG/5ML PO SYRP: 5.0000 mL | ORAL_SOLUTION | Freq: Four times a day (QID) | ORAL | Status: DC | PRN

## 2019-07-22 MED ORDER — ALBUTEROL SULFATE HFA 108 (90 BASE) MCG/ACT IN AERS
1.0000 | INHALATION_SPRAY | RESPIRATORY_TRACT | Status: DC | PRN
  Filled 2019-07-22: qty 6.7

## 2019-07-22 MED ORDER — PREDNISONE 10 MG (21) PO TBPK: 10.0000 mg | ORAL_TABLET | ORAL | Status: DC

## 2019-07-22 MED ORDER — FOLIC ACID 1 MG PO TABS
1.0000 mg | ORAL_TABLET | Freq: Every day | ORAL | Status: DC
  Administered 2019-07-23 – 2019-08-14 (×23): 1 mg via ORAL
  Filled 2019-07-22 (×23): qty 1

## 2019-07-22 MED ORDER — PREDNISONE 10 MG (21) PO TBPK: 10.0000 mg | ORAL_TABLET | Freq: Three times a day (TID) | ORAL | Status: DC

## 2019-07-22 MED ORDER — OXYCODONE HCL 5 MG PO TABS
5.0000 mg | ORAL_TABLET | ORAL | Status: DC | PRN
  Administered 2019-07-22 – 2019-08-01 (×38): 5 mg via ORAL
  Filled 2019-07-22 (×39): qty 1

## 2019-07-22 MED ORDER — INSULIN GLARGINE 100 UNIT/ML ~~LOC~~ SOLN
30.0000 [IU] | Freq: Every day | SUBCUTANEOUS | Status: DC
  Administered 2019-07-23 – 2019-08-15 (×24): 30 [IU] via SUBCUTANEOUS
  Filled 2019-07-22 (×24): qty 0.3

## 2019-07-22 MED ORDER — NON FORMULARY: 6.0000 mg | Freq: Every day | Status: DC

## 2019-07-22 MED ORDER — PREDNISONE 10 MG (21) PO TBPK: 20.0000 mg | ORAL_TABLET | Freq: Every morning | ORAL | Status: DC

## 2019-07-22 MED ORDER — METOPROLOL TARTRATE 25 MG PO TABS
25.0000 mg | ORAL_TABLET | Freq: Two times a day (BID) | ORAL | Status: DC
  Administered 2019-07-22 – 2019-08-15 (×47): 25 mg via ORAL
  Filled 2019-07-22 (×49): qty 1

## 2019-07-22 MED ORDER — PROCHLORPERAZINE EDISYLATE 10 MG/2ML IJ SOLN: 5.0000 mg | Freq: Four times a day (QID) | INTRAMUSCULAR | Status: DC | PRN

## 2019-07-22 MED ORDER — BENZONATATE 100 MG PO CAPS: 100.0000 mg | ORAL_CAPSULE | Freq: Three times a day (TID) | ORAL | Status: DC | PRN

## 2019-07-22 MED ORDER — PREDNISONE 10 MG PO TABS: 10.0000 mg | ORAL_TABLET | ORAL | Status: DC

## 2019-07-22 MED ORDER — ATORVASTATIN CALCIUM 10 MG PO TABS
10.0000 mg | ORAL_TABLET | Freq: Every day | ORAL | Status: DC
  Administered 2019-07-22 – 2019-08-14 (×24): 10 mg via ORAL
  Filled 2019-07-22 (×24): qty 1

## 2019-07-22 MED ORDER — ACETAMINOPHEN 325 MG PO TABS
325.0000 mg | ORAL_TABLET | ORAL | Status: DC | PRN
  Administered 2019-07-22 – 2019-08-15 (×42): 650 mg via ORAL
  Filled 2019-07-22 (×43): qty 2

## 2019-07-22 MED ORDER — PREDNISONE 10 MG (21) PO TBPK
10.0000 mg | ORAL_TABLET | Freq: Four times a day (QID) | ORAL | Status: AC
  Administered 2019-07-25 – 2019-07-28 (×10): 10 mg via ORAL

## 2019-07-22 MED ORDER — INSULIN ASPART 100 UNIT/ML ~~LOC~~ SOLN
0.0000 [IU] | Freq: Three times a day (TID) | SUBCUTANEOUS | Status: DC
  Administered 2019-07-22: 2 [IU] via SUBCUTANEOUS
  Administered 2019-07-23 – 2019-07-25 (×3): 1 [IU] via SUBCUTANEOUS
  Administered 2019-07-28: 2 [IU] via SUBCUTANEOUS
  Administered 2019-08-03 – 2019-08-12 (×2): 1 [IU] via SUBCUTANEOUS

## 2019-07-22 MED ORDER — ALPRAZOLAM 0.25 MG PO TABS
0.1250 mg | ORAL_TABLET | Freq: Two times a day (BID) | ORAL | Status: DC | PRN
  Administered 2019-07-27 – 2019-08-08 (×18): 0.125 mg via ORAL
  Filled 2019-07-22 (×19): qty 1

## 2019-07-22 MED ORDER — LIDOCAINE 5 % EX PTCH
2.0000 | MEDICATED_PATCH | CUTANEOUS | Status: DC
  Administered 2019-07-23 – 2019-07-25 (×3): 2 via TRANSDERMAL
  Administered 2019-07-26: 1 via TRANSDERMAL
  Administered 2019-07-27 – 2019-08-15 (×19): 2 via TRANSDERMAL
  Filled 2019-07-22 (×24): qty 2

## 2019-07-22 MED ORDER — PREDNISONE 10 MG PO TABS: 10.0000 mg | ORAL_TABLET | Freq: Three times a day (TID) | ORAL | Status: DC

## 2019-07-22 MED ORDER — GUAIFENESIN 200 MG PO TABS: 200.0000 mg | ORAL_TABLET | Freq: Three times a day (TID) | ORAL | Status: DC

## 2019-07-22 MED ORDER — MUSCLE RUB 10-15 % EX CREA
TOPICAL_CREAM | Freq: Three times a day (TID) | CUTANEOUS | Status: DC
  Administered 2019-07-22 – 2019-08-04 (×11): via TOPICAL
  Administered 2019-08-05: 1 via TOPICAL
  Administered 2019-08-05 – 2019-08-13 (×18): via TOPICAL
  Filled 2019-07-22: qty 85

## 2019-07-22 MED ORDER — PROCHLORPERAZINE 25 MG RE SUPP: 12.5000 mg | Freq: Four times a day (QID) | RECTAL | Status: DC | PRN

## 2019-07-22 MED ORDER — ENOXAPARIN SODIUM 40 MG/0.4ML ~~LOC~~ SOLN
40.0000 mg | SUBCUTANEOUS | Status: DC
  Administered 2019-07-22 – 2019-08-14 (×24): 40 mg via SUBCUTANEOUS
  Filled 2019-07-22 (×24): qty 0.4

## 2019-07-22 MED ORDER — PREDNISONE 10 MG (21) PO TBPK
10.0000 mg | ORAL_TABLET | Freq: Three times a day (TID) | ORAL | Status: AC
  Administered 2019-07-24 (×3): 10 mg via ORAL

## 2019-07-22 MED ORDER — DIPHENHYDRAMINE HCL 12.5 MG/5ML PO ELIX: 12.5000 mg | ORAL_SOLUTION | Freq: Four times a day (QID) | ORAL | Status: DC | PRN

## 2019-07-22 MED ORDER — FLEET ENEMA 7-19 GM/118ML RE ENEM
1.0000 | ENEMA | Freq: Once | RECTAL | Status: DC | PRN
  Filled 2019-07-22: qty 1

## 2019-07-22 MED ORDER — MODAFINIL 100 MG PO TABS
100.0000 mg | ORAL_TABLET | Freq: Every day | ORAL | Status: DC
  Administered 2019-07-23 – 2019-08-14 (×23): 100 mg via ORAL
  Filled 2019-07-22 (×23): qty 1

## 2019-07-22 MED ORDER — POLYETHYLENE GLYCOL 3350 17 G PO PACK
17.0000 g | PACK | Freq: Every day | ORAL | Status: DC | PRN
  Administered 2019-08-04 – 2019-08-07 (×2): 17 g via ORAL
  Filled 2019-07-22 (×2): qty 1

## 2019-07-22 NOTE — Progress Notes (Signed)
Pt arrived to [facility] from [facility] via hospital bed. Pt has no personal belongings with her. Pt educated on valuables policy, fall policy, and visitor policy. Pt pain rated at 7/10 in lower back. Pt states she has chronic lower back pain. No other issues, pt in no distress. Will continue to monitor. Bed in lowest position, bed alarm on, fall demonstrated use of call bell system with the nurse. Larina Earthly, LPN

## 2019-07-22 NOTE — Progress Notes (Signed)
Raechel Ache, OT  Rehab Admission Coordinator  Physical Medicine and Rehabilitation  PMR Pre-admission  Signed  Encounter Date:  07/19/2019      Related encounter: Documentation from 07/19/2019 in [facility]         PMR Admission Coordinator Pre-Admission Assessment  Patient: Vanessa Mack is an 68 y.o., female MRN: [id number] DOB: [birth year] Height: 5' 7"  (1.702 m) Weight: 96.2 kg   Insurance Information HMO:     PPO:      PCP:      IPA:      80/20: yes     OTHER:   PRIMARY: Medicare Part A and B      Policy#: [id number]      Subscriber: Patient CM Name:       Phone#:      Fax#:  Pre-Cert#:       Employer:  Benefits:  Phone #: online     Name: verified eligibility via [place] on 07/22/2019 Eff. Date: Part A and B effective 06/04/2008     Deduct: $1,408      Out of Pocket Max: NA      Life Max: NA CIR: Covered per Medicare guidelines once yearly deductible has been met.      SNF: days 1-20, 100%, days 21-100, 80% Outpatient: 80%     Co-Pay: 20% Home Health: 100%      Co-Pay:  DME: 80%     Co-Pay: 20% Providers: Pt's choice  *Pt is a QMB **According to [facility], on day of admit to CIR (11/17) pt will have 8 full Medicare days remaining. Anticipate pt will begin using her co-pay days while in IP Rehab. Pt and family aware that she will be using her co-pay days ($352/day once full days are used up).   SECONDARY: Medicaid [place]      Policy#: [id number]      Subscriber: Patient Coverage Code: [id number] CM Name:       Phone#:      Fax#:  Pre-Cert#:       Employer:  Benefits:  Phone #: [phone]     Name: automated system Eff. Date: verified eligibility on 07/22/2019     Deduct:       Out of Pocket Max:       Life Max:  CIR:       SNF:  Outpatient:      Co-Pay:  Home Health:       Co-Pay:  DME:      Co-Pay:   Medicaid Application Date:       Case Manager:  Disability Application Date:       Case Worker:    The Data Collection Information Summary for patients in Inpatient Rehabilitation Facilities with attached Privacy Act [place] Records was provided and verbally reviewed with: Patient  Emergency Contact Information         Contact Information    Name Relation Home Work [place], Vanessa Mack Son [phone]     *[name] (primary contact) Son [phone]        Current Medical History  Patient Admitting Diagnosis: Debility after Covid-19 respiratory failure  History of Present Illness: Pt is a 68 yo Female with a past medical history significant for diabetes, CAD, obstructive sleep apnea, hyperlipidemia, CVA, and COPD. Pt uses 2L  at home. Pt initially presented to [facility] after 1 week of malaise, DOE/SOB, and cough. Pt had recently been exposed to  COVID from her [facility] aide who was positive for the virus. [facility] pt required 6L [place] to maintain sats, was febrile, hypotensive, and tachycardic. Pt was admitted to the ICU, found to be COVID-19 positive and transferred to to [facility] on 9/27 for further management. Pt developed worsening hypoxia from COVID PNA and required intubation on 9/29. Pt completed a 10 day course of remdesivir/dexamethasone and intermittently diuresed. Pt required increased vent support due to desaturations and required a 10 day course of vancoymycin/cefepime. Pt was given a poor prognosis and goals of care discussion occurred with family. Thankfully, pt's vent requirements gradually improved and pt was able to be extubated on 10/26. She was able to be weaned to 2L and sent to [facility] on rocephin due to E coli HAP. At [facility], pt progressed to room air and diet was able to be upgraded to carb modified, thin liquids. Pt's confusion has improved but remains with significant weakness at this time. Pt has been assessed by therapies with recommendation for CIR due to debility. Pt is to admit to CIR on  07/22/2019.   Patient's medical record from [facility] has been reviewed by the rehabilitation admission coordinator and physician.  Past Medical History  Past Medical History:  Diagnosis Date   Aseptic necrosis of femur (HCC)    Chronic respiratory failure with hypoxia (HCC)    Colon adenomas    COPD (chronic obstructive pulmonary disease) (HCC)    Cough with hemoptysis 10/2018   COVID-19 05/31/2019   Diverticulosis    DOE (dyspnea on exertion)    Fibromyalgia    GAD (generalized anxiety disorder)    Glaucoma    Hematuria    HTN (hypertension)    Impaired functional mobility, balance, gait, and endurance    Intrinsic asthma    Lumbar radiculopathy    Major depressive disorder, recurrent episode, moderate (HCC)    Nonobstructive atherosclerosis of coronary artery    OSA (obstructive sleep apnea)    Polyneuropathy    Prolonged Q-T interval on ECG    Psoriatic arthritis (HCC)    PUD (peptic ulcer disease)    Sepsis due to COVID-19 ([place]) 05/2019   Stroke (cerebrum) (HCC)    T2DM (type 2 diabetes mellitus) ([place])    Venous stasis of lower extremity     Family History   family history includes Clotting disorder in her mother; Diabetes in her mother; Psoriasis in her father; Skin cancer in her mother.  Prior Rehab/Hospitalizations Has the patient had prior rehab or hospitalizations prior to admission? Yes  Has the patient had major surgery during 100 days prior to admission? No              Current Medications No current outpatient medications on file.   See MAR   Patients Current Diet:     Diet Order    None      Precautions / Restrictions Monitor HR, Spo2 with activity      Has the patient had 2 or more falls or a fall with injury in the past year? No  Prior Activity Level Pt reports independent PTA but has an aide M-F that would assist with cooking, cleaning, and sometimes  supervise her ADLs when she needed it. Pt utilized a 4pt cane for ambulation and was Independent in her medication management.   Prior Functional Level Self Care: Did the patient need help bathing, dressing, using the toilet or eating? Independent  Indoor Mobility: Did the  patient need assistance with walking from room to room (with or without device)? Independent  Stairs: Did the patient need assistance with internal or external stairs (with or without device)? Needed some help  Functional Cognition: Did the patient need help planning regular tasks such as shopping or remembering to take medications? Independent  Home Assistive Devices / Equipment  Prior Device Use: Indicate devices/aids used by the patient prior to current illness, exacerbation or injury? 4 pronged cane   Prior Functional Level Current Functional Level  Bed Mobility  Independent  Min A   Transfers  Mod Independent   Mod A sit to stand   Mobility - Walk/Wheelchair  Mod Independent   NT   Upper Body Dressing  Independent   Min A   Lower Body Dressing  Independent   Dependent   Grooming  Independent   set up A   Eating/Drinking  Independent   set up A   Toilet Transfer  Independent   NT   Bladder Continence   continent   continent   Bowel Management  continent   continent   Stair Climbing    Dependent   Communication  WNL   WNL   Memory  WNL  WNL per this AC assessment   Driving      Special needs/care consideration BiPAP/CPAP : has used CPAP in past but has been without it >1 yr now at this time.  CPM : no Continuous Drip IV : none Dialysis : no        Days : no Life Vest : no Oxygen : on RA Special Bed : air overlay  Trach Size : no Wound Vac (area) : no      Location : no Skin: Per chart: unstageable sacrococcyx pressure injury, unstagable L ear abrasion, right heel stage 1, right and left anterior foot hyperlaurotic skin  rash, anterior chest abdominal abrasions.             Bowel mgmt: last BM 11/15 Bladder mgmt: continent Diabetic mgmt: yes Behavioral consideration : no Chemo/radiation : pt reports being on medication for psoriasis that is a maintenance medication    Previous Home Environment  Pt lived alone in an apartment. She lives on the ground floor with level entry. She used a walk in shower that had a seat, an elevated commode, and a bathroom that was accessible via RW. Pt has rails throughout her apartment and emergency call buttons. Pt utlized supplemental O2 at home (2-3 L day/night).  Pt has an aide who provided supervision during ADLs at times when she was weak as well as IADL support (cooking, cleaning). Aide was utilized M-F 3 hr/day.   Discharge Living Setting Plan is to DC to her son's house Vanessa Mack). He lives in a one story home with 3 steps to enter with bilateral rails. Pt has options of using tub/shower or walk in shower with standard commode and a bathroom that is assessable vis RW.  Pt does not have trouble obtaining medications at DC as her son's drive.   Social/Family/Support Systems Pt has very supportive sons (primary contact: [name] [phone]) (secondary contact: Jason: [phone]) Plan is to DC to Jason's house where he is available 24/7 and can do up to Min/Mod A; he works from home.  Discharge plan has been discussed with pt and Vanessa Mack and Training and development officer (both sons) are in agreement with the plan.  Caregiver does not have issues with lodging/transportation while pt is in rehab.   Goals/Additional Needs Patient/family  Goal for Rehab: PT/OT: Supervision; SLP: NA Expected length of stay: 12-16 days Cultural considerations: NA Dietary Needs: carb modified, regular diet, thin liquids Equipment needs: TBD Pt and family agree to admission and willing to participate: yes Program orientation provided and reviewed with pt and her sons Barriers to DC: home environment  (Stairs to enter son's house). Cost of care-pt will most likely enter her Medicare co-payment days.    Decrease burden of Care through IP rehab admission: NA  Possible need for SNF placement upon discharge: Not anticipated; pt and her family are willing and able to assist her at home after IP rehab. Pt has good prognosis for further progress through IP rehab. Anticipate pt will be able to make measurable gains in a reasonable amount of time through CIR without need for SNF.   Patient Condition: I have reviewed medical records from [facility], spoken with CM, and patient and son. I met with patient at the bedside for inpatient rehabilitation assessment.  Patient will benefit from ongoing PT and OT, can actively participate in 3 hours of therapy a day 5 days of the week, and can make measurable gains during the admission.  Patient will also benefit from the coordinated team approach during an Inpatient Acute Rehabilitation admission.  The patient will receive intensive therapy as well as Rehabilitation physician, nursing, social worker, and care management interventions.  Due to safety, skin/wound care, disease management, medication administration, pain management and patient education the patient requires 24 hour a day rehabilitation nursing.  The patient is currently Mod A to stand, no gait attempted at this time and Min A to dependent for basic ADLs.  Discharge setting and therapy post discharge at home with home health is anticipated.  Patient has agreed to participate in the Acute Inpatient Rehabilitation Program and will admit 07/22/2019.  Preadmission Screen Completed By:  Raechel Ache, 07/22/2019 12:24 PM ______________________________________________________________________   Discussed status with Dr. Naaman Plummer on 07/22/2019 at 12:17PM and received approval for admission today.  Admission Coordinator:  Raechel Ache, OT, time 12:21PM/Date 07/22/2019    Assessment/Plan: Diagnosis: debility after COVID respiratory failure 1. Does the need for close, 24 hr/day Medical supervision in concert with the patient's rehab needs make it unreasonable for this patient to be served in a less intensive setting? Yes 2. Co-Morbidities requiring supervision/potential complications: DM, CAD, OSA, COPD, prior CVA 3. Due to bladder management, bowel management, safety, skin/wound care, disease management, medication administration, pain management and patient education, does the patient require 24 hr/day rehab nursing? Yes 4. Does the patient require coordinated care of a physician, rehab nurse, PT, OT, and SLP to address physical and functional deficits in the context of the above medical diagnosis(es)? Yes Addressing deficits in the following areas: balance, endurance, locomotion, strength, transferring, bowel/bladder control, bathing, dressing, feeding, grooming, toileting and psychosocial support 5. Can the patient actively participate in an intensive therapy program of at least 3 hrs of therapy 5 days a week? Yes 6. The potential for patient to make measurable gains while on inpatient rehab is excellent 7. Anticipated functional outcomes upon discharge from inpatient rehab: supervision PT, supervision OT, n/a SLP 8. Estimated rehab length of stay to reach the above functional goals is: 13-17 days 9. Anticipated discharge destination: Home 10. Overall Rehab/Functional Prognosis: excellent  MD Signature Meredith Staggers, MD, [facility] 07/22/2019         Cosigned by: Meredith Staggers, MD at 07/22/2019 1:04 PM  Revision History  Date/Time User Provider Type Action  07/22/2019 1:04 PM Meredith Staggers, MD Physician Cosign  07/22/2019 12:55 PM Meredith Staggers, MD Physician Sign  07/22/2019 12:25 PM Raechel Ache, [place] Rehab Admission Coordinator Share  View Details Report

## 2019-07-22 NOTE — H&P (Signed)
Physical Medicine and Rehabilitation Admission H&P    CC: Debility   HPI: Vanessa Mack is a 68 year old female with history of T2DM, non-obstructive CAD, OSA, CVA, COPD with chronic respiratory failure, GAD/depression who was admitted to [facility]  9/27- 07/03/19 for COVID 19 PNA with septic shock and respiratory failure requiring intubation. She was treated with Vanc/Cefepime, remdesivir, decadron, Flolan and Firazyr trial. She had difficulty with vent wean --family declined tracheostomy and patient ultimately weaned to 4 L oxygen per [place]. She was discharged to [facility]  10/29 with recommendations to continue one additional day antibiotics for E coli HAP. She was maintained on tube feeds and has been advanced to regular textures.   She is very eager to be admitted to rehabilitation to become stronger, and is motivated to participate in therapies. She denies SOB or weakness. She has two sons and one plans to stay with her following discharge home. Continues to have some low back and neck pain for which heating pads help. She also has a lidocaine patch applied.  She has used Trazodone in the past to help her sleep (300mg  HS) and Xanax for anxiety. She does feel anxious at times but fortunately slept well last night after several nights of poor sleep. Denies constipation and feels she is actually moving bowels more frequently than usual.  ROS     Past Medical History:  Diagnosis Date  . Aseptic necrosis of femur ([place])   . Chronic respiratory failure with hypoxia ([place])   . Colon adenomas   . COPD (chronic obstructive pulmonary disease) ([place])   . Cough with hemoptysis 10/2018  . COVID-19 05/31/2019  . Diverticulosis   . DOE (dyspnea on exertion)   . Fibromyalgia   . GAD (generalized anxiety disorder)   . Glaucoma   . Hematuria   . HTN (hypertension)   . Impaired functional mobility, balance, gait, and endurance   . Intrinsic asthma   . Lumbar radiculopathy   . Major depressive  disorder, recurrent episode, moderate ([place])   . Nonobstructive atherosclerosis of coronary artery   . OSA (obstructive sleep apnea)   . Polyneuropathy   . Prolonged Q-T interval on ECG   . Psoriatic arthritis ([place])   . PUD (peptic ulcer disease)   . Sepsis due to COVID-19 ([place]) 05/2019  . Stroke (cerebrum) ([place])   . T2DM (type 2 diabetes mellitus) ([place])   . Venous stasis of lower extremity         Past Surgical History:  Procedure Laterality Date  . EXPLORATORY LAPAROTOMY    . 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 Diabetes Mother   . Skin cancer Mother   . Clotting disorder Mother   . Psoriasis Father    Social History:  has no history on file for tobacco, alcohol, and drug. Allergies:      Allergies  Allergen Reactions  . Ibuprofen Rash   No medications prior to admission.    Drug Regimen Review  Drug regimen was reviewed and remains appropriate with no significant issues identified  Home: Home Living Family/patient expects to be discharged to:: Private residence Living Arrangements: Alone   Functional History:  Functional Status:  Mobility:  ADL:  Cognition: Cognition Orientation Level: Oriented X4  Physical Exam: Blood pressure 113/82, pulse 100, resp. rate 16, height 5\' 7"  (1.702 m), weight 94.5 kg, SpO2 94 %. Physical Exam  Lab Results Last 48  Hours        Results for orders placed or performed during the hospital encounter of 07/22/19 (from the past 48 hour(s))  Hemoglobin A1c     Status: Abnormal   Collection Time: 07/22/19  2:54 PM  Result Value Ref Range   Hgb A1c MFr Bld 6.7 (H) 4.8 - 5.6 %    Comment: (NOTE) Pre diabetes:          5.7%-6.4% Diabetes:              >6.4% Glycemic control for   <7.0% adults with diabetes    Mean Plasma Glucose 145.59 mg/dL    Comment: Performed at [facility] [address] [place], [place] [address]      Imaging Results (Last 48 hours)  No results found.   Medical Problem List and Plan: 1.  Impaired mobility and ADLs secondary to COVID-19 2.  Antithrombotics: -DVT/anticoagulation:  Pharmaceutical: Lovenox             -antiplatelet therapy: N/A 3. Chronic LBP/Pain Management: Currently 7/10. Oxycodone 5mg  prn. Apply heating pads 3 times per day PRN for low back pain, had provided patient comfort in the past. Lidocaine patch. Was on Oxycodone 15mg  PRN PTA.  4. Mood: LCSW to follow for evaluation and support. Team to provide ego support.             -antipsychotic agents: N/A 5. Neuropsych: This patient is capable of making decisions on her own behalf. 6. Skin/Wound Care:  Routine pressure relief measures.  7. Fluids/Electrolytes/Nutrition: Monitor I/O. Check lytes in am. 8. COPD: Has been on IV solumedrol with recommendations to start steroid taper. 9. T2DM with neuropathy: Continue Lantus 30 units daily. Will monitor BS ac/hs and use SSI as needed for tighter control. 10. H/o depression: Monitor for now--not on any meds at this time. 11. HTN: Monitor BP tid--currently on metoprolol 25mg  BID 12. OSA: CPAP at nights with 2L oxygen bled in.  13. Insomnia: Slept well last night, but if insomnia returns consider making Trazodone standing and/or increasing dose. Patient states she took 300mg  and is currently on 25/50mg  PRN.  14. Anxiety: Xanax 0.125mg  BID PRN. Was on 1mg  BID PTA.  15. HLD: Continue atorvastatin 10mg  HS. 16. Disposition: will require physiatry follow-up upon discharge. Son plans to stay home with patient upon her discharge.    Bary Leriche, PA-C 07/22/2019  I have personally performed a face to face diagnostic evaluation, including, but not limited to relevant history and physical exam findings, of this patient and developed relevant assessment and plan.  Additionally, I have reviewed and concur with the physician assistant's documentation above.  The patient's status has  not changed. The original post admission physician evaluation remains appropriate, and any changes from the pre-admission screening or documentation from the acute chart are noted above.   Leeroy Cha, MD

## 2019-07-23 ENCOUNTER — Inpatient Hospital Stay (HOSPITAL_COMMUNITY): Payer: Medicare Other | Admitting: Physical Therapy

## 2019-07-23 ENCOUNTER — Inpatient Hospital Stay (HOSPITAL_COMMUNITY): Payer: Medicare Other | Admitting: Occupational Therapy

## 2019-07-23 ENCOUNTER — Inpatient Hospital Stay (HOSPITAL_COMMUNITY): Payer: Medicare Other

## 2019-07-23 DIAGNOSIS — E119 Type 2 diabetes mellitus without complications: Secondary | ICD-10-CM

## 2019-07-23 DIAGNOSIS — E876 Hypokalemia: Secondary | ICD-10-CM | POA: Diagnosis not present

## 2019-07-23 DIAGNOSIS — G4733 Obstructive sleep apnea (adult) (pediatric): Secondary | ICD-10-CM

## 2019-07-23 DIAGNOSIS — R5381 Other malaise: Secondary | ICD-10-CM | POA: Diagnosis not present

## 2019-07-23 DIAGNOSIS — G479 Sleep disorder, unspecified: Secondary | ICD-10-CM | POA: Diagnosis not present

## 2019-07-23 DIAGNOSIS — J449 Chronic obstructive pulmonary disease, unspecified: Secondary | ICD-10-CM

## 2019-07-23 LAB — COMPREHENSIVE METABOLIC PANEL
ALT: 95 U/L — ABNORMAL HIGH (ref 0–44)
AST: 42 U/L — ABNORMAL HIGH (ref 15–41)
Albumin: 2.6 g/dL — ABNORMAL LOW (ref 3.5–5.0)
Alkaline Phosphatase: 143 U/L — ABNORMAL HIGH (ref 38–126)
Anion gap: 12 (ref 5–15)
BUN: 14 mg/dL (ref 8–23)
CO2: 25 mmol/L (ref 22–32)
Calcium: 8.8 mg/dL — ABNORMAL LOW (ref 8.9–10.3)
Chloride: 103 mmol/L (ref 98–111)
Creatinine, Ser: 0.39 mg/dL — ABNORMAL LOW (ref 0.44–1.00)
GFR calc Af Amer: >60 mL/min (ref >60)
GFR calc non Af Amer: >60 mL/min (ref >60)
Glucose, Bld: 111 mg/dL — ABNORMAL HIGH (ref 70–99)
Potassium: 3.3 mmol/L — ABNORMAL LOW (ref 3.5–5.1)
Sodium: 140 mmol/L (ref 135–145)
Total Bilirubin: 0.2 mg/dL — ABNORMAL LOW (ref 0.3–1.2)
Total Protein: 5.6 g/dL — ABNORMAL LOW (ref 6.5–8.1)

## 2019-07-23 LAB — CBC WITH DIFFERENTIAL/PLATELET
Abs Immature Granulocytes: 0.27 10*3/uL — ABNORMAL HIGH (ref 0.00–0.07)
Basophils Absolute: 0 10*3/uL (ref 0.0–0.1)
Basophils Relative: 0 %
Eosinophils Absolute: 0 10*3/uL (ref 0.0–0.5)
Eosinophils Relative: 0 %
HCT: 38.8 % (ref 36.0–46.0)
Hemoglobin: 12.6 g/dL (ref 12.0–15.0)
Immature Granulocytes: 2 %
Lymphocytes Relative: 17 %
Lymphs Abs: 2.5 10*3/uL (ref 0.7–4.0)
MCH: 29.2 pg (ref 26.0–34.0)
MCHC: 32.5 g/dL (ref 30.0–36.0)
MCV: 90 fL (ref 80.0–100.0)
Monocytes Absolute: 1.1 10*3/uL — ABNORMAL HIGH (ref 0.1–1.0)
Monocytes Relative: 8 %
Neutro Abs: 10.3 10*3/uL — ABNORMAL HIGH (ref 1.7–7.7)
Neutrophils Relative %: 73 %
Platelets: 168 10*3/uL (ref 150–400)
RBC: 4.31 MIL/uL (ref 3.87–5.11)
RDW: 14.7 % (ref 11.5–15.5)
WBC: 14.2 10*3/uL — ABNORMAL HIGH (ref 4.0–10.5)
nRBC: 0 % (ref 0.0–0.2)

## 2019-07-23 LAB — GLUCOSE, CAPILLARY
Glucose-Capillary: 110 mg/dL — ABNORMAL HIGH (ref 70–99)
Glucose-Capillary: 135 mg/dL — ABNORMAL HIGH (ref 70–99)
Glucose-Capillary: 172 mg/dL — ABNORMAL HIGH (ref 70–99)
Glucose-Capillary: 192 mg/dL — ABNORMAL HIGH (ref 70–99)

## 2019-07-23 MED ORDER — TRAZODONE HCL 50 MG PO TABS
50.0000 mg | ORAL_TABLET | Freq: Every day | ORAL | Status: DC
  Administered 2019-07-23 – 2019-07-29 (×8): 50 mg via ORAL
  Filled 2019-07-23 (×7): qty 1

## 2019-07-23 MED ORDER — INFLUENZA VAC A&B SA ADJ QUAD 0.5 ML IM PRSY
0.5000 mL | PREFILLED_SYRINGE | INTRAMUSCULAR | Status: AC
  Administered 2019-07-24: 0.5 mL via INTRAMUSCULAR
  Filled 2019-07-23: qty 0.5

## 2019-07-23 MED ORDER — POTASSIUM CHLORIDE CRYS ER 20 MEQ PO TBCR
20.0000 meq | EXTENDED_RELEASE_TABLET | Freq: Every day | ORAL | Status: DC
  Administered 2019-07-23 – 2019-07-28 (×6): 20 meq via ORAL
  Filled 2019-07-23 (×6): qty 1

## 2019-07-23 NOTE — Evaluation (Signed)
Occupational Therapy Assessment and Plan  Patient Details  Name: Vanessa Mack MRN: [id number] Date of Birth: 11-09-1950  OT Diagnosis: muscle weakness (generalized) Rehab Potential: Rehab Potential (ACUTE ONLY): Good ELOS: ~3 weeks   Today's Date: 07/23/2019 OT Individual Time: 1405-1500 OT Individual Time Calculation (min): 55 min     Problem List:  Patient Active Problem List   Diagnosis Date Noted  . Physical debility 07/22/2019    Past Medical History:  Past Medical History:  Diagnosis Date  . Aseptic necrosis of femur ([place])   . Chronic respiratory failure with hypoxia ([place])   . Colon adenomas   . COPD (chronic obstructive pulmonary disease) ([place])   . Cough with hemoptysis 10/2018  . COVID-19 05/31/2019  . Diverticulosis   . DOE (dyspnea on exertion)   . Fibromyalgia   . GAD (generalized anxiety disorder)   . Glaucoma   . Hematuria   . HTN (hypertension)   . Impaired functional mobility, balance, gait, and endurance   . Intrinsic asthma   . Lumbar radiculopathy   . Major depressive disorder, recurrent episode, moderate ([place])   . Nonobstructive atherosclerosis of coronary artery   . OSA (obstructive sleep apnea)   . Polyneuropathy   . Prolonged Q-T interval on ECG   . Psoriatic arthritis ([place])   . PUD (peptic ulcer disease)   . Sepsis due to COVID-19 ([place]) 05/2019  . Stroke (cerebrum) ([place])   . T2DM (type 2 diabetes mellitus) ([place])   . Venous stasis of lower extremity    Past Surgical History:  Past Surgical History:  Procedure Laterality Date  . EXPLORATORY LAPAROTOMY    . TOTAL KNEE ARTHROPLASTY Right 09/2017    Assessment & Plan Clinical Impression: Patient is a 68 y.o. year old female history of T2DM, non-obstructive CAD, OSA, CVA, COPD with chronic respiratory failure, GAD/depression who was admitted to [facility] 9/27- 07/03/19 for COVID 19 PNA with septic shock and respiratory failure requiring intubation. She was treated with Vanc/Cefepime,  remdesivir, decadron, Flolan and Firazyr trial. She had difficulty with vent wean --family declined tracheostomy and patient ultimately weaned to 4 L oxygen per [place]. She was discharged to [facility] 10/29 with recommendations to continue one additional day antibiotics for E coli HAP. She was maintained on tube feeds and has been advanced to regular textures.   She is very eager to be admitted to rehabilitation to become stronger, and is motivated to participate in therapies. She denies SOB or weakness. She has two sons and one plans to stay with her following discharge home. Continues to have some low back and neck pain for which heating pads help. She also has a lidocaine patch applied.  She has used Trazodone in the past to help her sleep (344m HS) and Xanax for anxiety. She does feel anxious at times but fortunately slept well last night after several nights of poor sleep. Patient transferred to CIR on 07/22/2019 .    Patient currently requires max- total A  with basic self-care skills and basic mobility secondary to muscle weakness, decreased cardiorespiratoy endurance, unbalanced muscle activation, decreased awareness, decreased problem solving, decreased safety awareness and delayed processing and decreased sitting balance and decreased standing balance.  Prior to hospitalization, patient could complete ADL with independent .  Patient will benefit from skilled intervention to decrease level of assist with basic self-care skills and increase independence with basic self-care skills prior to discharge home with care partner.  Anticipate patient will require intermittent supervision and follow up home  health.  OT - End of Session Activity Tolerance: Tolerates 30+ min activity with multiple rests Endurance Deficit: Yes OT Assessment Rehab Potential (ACUTE ONLY): Good OT Patient demonstrates impairments in the following area(s): Safety;Balance;Edema;Endurance;Motor;Pain OT Basic ADL's Functional  Problem(s): Grooming;Bathing;Dressing;Toileting OT Transfers Functional Problem(s): Toilet;Tub/Shower OT Additional Impairment(s): Fuctional Use of Upper Extremity OT Plan OT Intensity: Minimum of 1-2 x/day, 45 to 90 minutes OT Frequency: 5 out of 7 days OT Duration/Estimated Length of Stay: ~3 weeks OT Treatment/Interventions: Balance/vestibular training;Discharge planning;Pain management;Self Care/advanced ADL retraining;Therapeutic Activities;UE/LE Coordination activities;Disease mangement/prevention;Functional mobility training;Patient/family education;Skin care/wound managment;Therapeutic Exercise;Community reintegration;DME/adaptive equipment instruction;Neuromuscular re-education;Psychosocial support;Splinting/orthotics;UE/LE Strength taining/ROM;Wheelchair propulsion/positioning OT Self Feeding Anticipated Outcome(s): n/a OT Basic Self-Care Anticipated Outcome(s): supervision to min A OT Toileting Anticipated Outcome(s): contact guard OT Bathroom Transfers Anticipated Outcome(s): contact guard OT Recommendation Patient destination: Home Follow Up Recommendations: Home health OT Equipment Recommended: To be determined   Skilled Therapeutic Intervention OT eval initiated with Ot goals, role and purpose discussed. Pt received in w/c pt taken into bathroom. Pt transfered scoot pivot with max A. Pt with difficulty with lifting for buttock clearance to the toilet with use of grab bar. Pt required total A for toileting. Used Stedy to get off toilet but required +2 max A to come into standing with perched seat to transition back to the bed. Pt participated in bathing and dressing at EOB. Pt with elevated HR up to the 150s with sit to stands and donning shirt. Pt able to wash UB and don shirt with min A with many rest breaks due to fatigue and elevated HR. Pt performed sit to stand with Stedy from EOB with max A for washing peri area with total A. Donned underwear (only) with total A. Returned to  supine with mod A. Left resting in the bed.   OT Evaluation Precautions/Restrictions  Precautions Precautions: Fall;Other (comment) Precaution Comments: monitor HR, SpO2 Restrictions Weight Bearing Restrictions: No General Chart Reviewed: Yes Family/Caregiver Present: No Vital Signs Therapy Vitals Temp: 97.7 F (36.5 C) Temp Source: Oral Pulse Rate: (!) 109 Resp: 19 BP: 123/79 Patient Position (if appropriate): Sitting Oxygen Therapy SpO2: 99 % O2 Device: Room Air Pain Pain Assessment Pain Scale: 0-10 Pain Score: 6  Pain Type: Chronic pain Pain Location: Back Pain Orientation: Mid;Lower Pain Descriptors / Indicators: Aching Pain Frequency: Constant Pain Onset: On-going Patients Stated Pain Goal: 0 Pain Intervention(s): Medication (See eMAR) Home Living/Prior Functioning Home Living Family/patient expects to be discharged to:: Private residence Living Arrangements: Alone Available Help at Discharge: Personal care attendant, Family Type of Home: Apartment Home Access: Level entry Home Layout: One level Bathroom Shower/Tub: Multimedia programmer: Handicapped height Bathroom Accessibility: Yes Additional Comments: pt's home has rails throughout, emergency pull cord/button, pt states it is "an apartment for the disabled."  Lives With: Alone Prior Function Level of Independence: Requires assistive device for independence, Independent with basic ADLs, Needs assistance with homemaking Driving: Yes Comments: enjoys church and tv ADL ADL Equipment Provided: Leg straps Grooming: Minimal assistance Upper Body Bathing: Minimal assistance Lower Body Bathing: Maximal assistance Where Assessed-Lower Body Bathing: Edge of bed Upper Body Dressing: Minimal assistance Where Assessed-Upper Body Dressing: Edge of bed Lower Body Dressing: Dependent Where Assessed-Lower Body Dressing: Edge of bed Toileting: Dependent Where Assessed-Toileting: Glass blower/designer:  Maximal Print production planner Method: Nutritional therapist: Not assessed Vision Baseline Vision/History: No visual deficits Patient Visual Report: No change from baseline Perception  Perception: Within Functional Limits Praxis Praxis: Intact Cognition Overall Cognitive  Status: Within Functional Limits for tasks assessed Arousal/Alertness: Awake/alert Orientation Level: Person;Place;Situation Person: Oriented Place: Oriented Situation: Oriented Year: 2020 Month: November Day of Week: Correct Memory: Appears intact Immediate Memory Recall: Sock;Blue;Bed Memory Recall Sock: Without Cue Memory Recall Blue: Without Cue Memory Recall Bed: Not able to recall Attention: Sustained Sustained Attention: Appears intact Awareness: Appears intact Problem Solving: Appears intact Safety/Judgment: Appears intact Sensation Sensation Light Touch: Appears Intact Proprioception: Appears Intact Stereognosis: Appears Intact Motor  Motor Motor - Skilled Clinical Observations: generalized weakness Mobility  Transfers Sit to Stand: Maximal Assistance - Patient 25-49%  Trunk/Postural Assessment  Cervical Assessment Cervical Assessment: Exceptions to [facility] Thoracic Assessment Thoracic Assessment: Exceptions to WFL(rounded shoulders) Lumbar Assessment Lumbar Assessment: Exceptions to WFL(posterior pelvic tilt) Postural Control Postural Control: Within Functional Limits  Balance Static Sitting Balance Static Sitting - Level of Assistance: 5: Stand by assistance Dynamic Sitting Balance Dynamic Sitting - Level of Assistance: 4: Min assist Static Standing Balance Static Standing - Level of Assistance: 2: Max assist Extremity/Trunk Assessment RUE Assessment General Strength Comments: 4/5 overall LUE Assessment General Strength Comments: 4/5 overall     Refer to Care Plan for Long Term Goals  Recommendations for other services: Neuropsych   Discharge Criteria:  Patient will be discharged from OT if patient refuses treatment 3 consecutive times without medical reason, if treatment goals not met, if there is a change in medical status, if patient makes no progress towards goals or if patient is discharged from hospital.  The above assessment, treatment plan, treatment alternatives and goals were discussed and mutually agreed upon: by patient  Nicoletta Ba 07/23/2019, 4:32 PM

## 2019-07-23 NOTE — Plan of Care (Signed)
  Problem: RH BOWEL ELIMINATION Goal: RH STG MANAGE BOWEL WITH ASSISTANCE Description: STG Manage Bowel with Min Assistance. Outcome: Progressing   Problem: RH BLADDER ELIMINATION Goal: RH STG MANAGE BLADDER WITH ASSISTANCE Description: STG Manage Bladder With Min Assistance Outcome: Progressing   Problem: RH SKIN INTEGRITY Goal: RH STG SKIN FREE OF INFECTION/BREAKDOWN Description: No new breakdown with min assist  Outcome: Progressing Goal: RH STG ABLE TO PERFORM INCISION/WOUND CARE W/ASSISTANCE Description: STG Able To Perform Incision/Wound Care With Mod Assistance. Outcome: Progressing   Problem: RH SAFETY Goal: RH STG ADHERE TO SAFETY PRECAUTIONS W/ASSISTANCE/DEVICE Description: STG Adhere to Safety Precautions With min Assistance/Device. Outcome: Progressing   Problem: RH PAIN MANAGEMENT Goal: RH STG PAIN MANAGED AT OR BELOW PT'S PAIN GOAL Description: <3 out of 10.  Outcome: Progressing

## 2019-07-23 NOTE — Evaluation (Signed)
Physical Therapy Assessment and Plan  Patient Details  Name: Vanessa Mack MRN: [id number] Date of Birth: 1950-12-30  PT Diagnosis: Abnormal posture, Difficulty walking, Dizziness and giddiness and Low back pain Rehab Potential: Good ELOS: 3 weeks   Today's Date: 07/23/2019 PT Individual Time: 0802-0915 PT Individual Time Calculation (min): 73 min    Problem List:  Patient Active Problem List   Diagnosis Date Noted  . Physical debility 07/22/2019    Past Medical History:  Past Medical History:  Diagnosis Date  . Aseptic necrosis of femur ([place])   . Chronic respiratory failure with hypoxia ([place])   . Colon adenomas   . COPD (chronic obstructive pulmonary disease) ([place])   . Cough with hemoptysis 10/2018  . COVID-19 05/31/2019  . Diverticulosis   . DOE (dyspnea on exertion)   . Fibromyalgia   . GAD (generalized anxiety disorder)   . Glaucoma   . Hematuria   . HTN (hypertension)   . Impaired functional mobility, balance, gait, and endurance   . Intrinsic asthma   . Lumbar radiculopathy   . Major depressive disorder, recurrent episode, moderate ([place])   . Nonobstructive atherosclerosis of coronary artery   . OSA (obstructive sleep apnea)   . Polyneuropathy   . Prolonged Q-T interval on ECG   . Psoriatic arthritis ([place])   . PUD (peptic ulcer disease)   . Sepsis due to COVID-19 ([place]) 05/2019  . Stroke (cerebrum) ([place])   . T2DM (type 2 diabetes mellitus) ([place])   . Venous stasis of lower extremity    Past Surgical History:  Past Surgical History:  Procedure Laterality Date  . EXPLORATORY LAPAROTOMY    . TOTAL KNEE ARTHROPLASTY Right 09/2017    Assessment & Plan Clinical Impression: Patient is a 68 y.o. year old female with history of T2DM, non-obstructive CAD, OSA, CVA, COPD with chronic respiratory failure, GAD/depression who was admitted to [facility] 9/[facility]- 07/03/19 for COVID 19 PNA with septic shock and respiratory failure requiring intubation. She was treated with  Vanc/Cefepime, remdesivir, decadron, Flolan and Firazyr trial. She had difficulty with vent wean --family declined tracheostomy and patient ultimately weaned to 4 L oxygen per [place]. She was discharged to [facility] 10/29 with recommendations to continue one additional day antibiotics for E coli HAP. She was maintained on tube feeds and has been advanced to regular texture.  Patient transferred to CIR on 07/22/2019 .   Patient currently requires max with mobility secondary to muscle weakness, decreased cardiorespiratoy endurance and decreased standing balance and decreased balance strategies.  Prior to hospitalization, patient was modified independent  with mobility and lived with Alone in a [place] home.  Home access is  Level entry.  Patient will benefit from skilled PT intervention to maximize safe functional mobility, minimize fall risk and decrease caregiver burden for planned discharge home with 24 hour supervision.  Anticipate patient will benefit from follow up [place] at discharge.  PT - End of Session Activity Tolerance: Tolerates 30+ min activity with multiple rests Endurance Deficit: Yes PT Assessment Rehab Potential (ACUTE/IP ONLY): Good PT Barriers to Discharge: Medical stability;Decreased caregiver support PT Barriers to Discharge Comments: increased HR with mobility PT Patient demonstrates impairments in the following area(s): Balance;Behavior;Motor;Endurance;Pain;Safety;Skin Integrity PT Transfers Functional Problem(s): Bed Mobility;Bed to Chair;Car;Furniture PT Locomotion Functional Problem(s): Ambulation;Wheelchair Mobility;Stairs PT Plan PT Intensity: Minimum of 1-2 x/day ,45 to 90 minutes PT Frequency: 5 out of 7 days PT Duration Estimated Length of Stay: 3 weeks PT Treatment/Interventions: Ambulation/gait training;Balance/vestibular training;Cognitive remediation/compensation;Disease management/prevention;Functional mobility training;Patient/family  education;Splinting/orthotics;Therapeutic Exercise;UE/LE Coordination activities;Therapeutic Activities;Discharge planning;Wheelchair propulsion/positioning;UE/LE Strength taining/ROM;Stair training;Neuromuscular re-education;DME/adaptive equipment instruction;Pain management PT Transfers Anticipated Outcome(s): supervision PT Locomotion Anticipated Outcome(s): supervision  Skilled Therapeutic Intervention Evaluation completed (see details above and below) with education on PT POC and goals and individual treatment initiated with focus on functional mobility, OOB tolerance, and transfers.  Pt supine in bed upon PT arrival, agreeable to therapy tx and reports pain as detailed below. Pt transferred to sitting EOB with mod assist, cues for techniques, use of bedrails. Pt seated EOB x 5 minutes with supervision while participating in strength/sensation examination as detailed below, also working on upright tolerance. Pt performed sit<>stand from bed with RW and max assist, cues for techniques and hand placement.   Vitals monitored with position changes this session:  Supine- BP 146/95, HR 105, SpO2 99% Sitting EOB- BP 140/95, HR 130 bpm, SpO2 98% Standing- BP 136/89, HR 163 bpm, SpO2 95% Pt attempted to stand from EOB, unable to reach standing on the first two attempts- HR increasing to 163 bpm, requiring seated rest breaks between attempts to allow HR to come back down. Pt performed second sit<>stand this session with max assist and RW, HR increased to 160 bpm and pt able to take x 2 steps in place with R LE, tries to take a step with her L LE and her knees buckled resulting in total LOB back onto the bed. Pt supine in bed requiring mod assist to sit back up. Pt performed slideboard transfer to w/c with max assist, cues for techniques. HR up to 140 bpm during slideboard transfer. Once sitting in w/c pt's heart rate came back down to 122 bpm, therapist discussed changes in HR with both the LPN and the MD  today. Pt left seated in w/c at end of session, agreeable to try sitting up for an hour to work on OOB activity tolerance. Chair alarm set and needs in reach. Therapist discussed transfer recommendations with the RN.       PT Evaluation Precautions/Restrictions Precautions Precautions: Fall;Other (comment) Precaution Comments: monitor HR, SpO2 Restrictions Weight Bearing Restrictions: No General   Vital Signs Pain Pain Assessment Pain Scale: 0-10 Pain Score: 8  Pain Type: Chronic pain Pain Location: Back Pain Orientation: Lower;Mid Pain Descriptors / Indicators: Aching Pain Frequency: Constant Pain Onset: On-going Patients Stated Pain Goal: 2 Pain Intervention(s): Medication (See eMAR) reports pain 7/10 in low back (repositioning for relief)  Home Living/Prior Functioning Home Living Available Help at Discharge: Personal care attendant(in home care M-F 3 hrs per day) Type of Home: Apartment Home Access: Level entry Home Layout: One level Bathroom Shower/Tub: Multimedia programmer: Handicapped height Bathroom Accessibility: Yes Additional Comments: pt's home has rails throughout, emergency pull cord/button, pt states it is "an apartment for the disabled."  Lives With: Alone Prior Function Level of Independence: Requires assistive device for independence;Independent with basic ADLs;Needs assistance with homemaking Driving: Yes Comments: enjoys church and tv Cognition Overall Cognitive Status: Within Functional Limits for tasks assessed Arousal/Alertness: Awake/alert Orientation Level: Oriented X4 Attention: Sustained Sustained Attention: Appears intact Awareness: Appears intact Safety/Judgment: Appears intact Sensation Sensation Light Touch: Appears Intact Proprioception: Appears Intact Additional Comments: sensation grossly intact B LEs, pt reports numbness in arms from chronic neck injury Coordination Gross Motor Movements are Fluid and Coordinated:  No Fine Motor Movements are Fluid and Coordinated: No Coordination and Movement Description: coordination impaired secondary to generalized weakness Motor  Motor Motor: Other (comment) Motor - Skilled Clinical Observations: generalized weakness  Mobility Bed  Mobility Bed Mobility: Supine to Sit;Sit to Supine Supine to Sit: Moderate Assistance - Patient 50-74% Sit to Supine: Moderate Assistance - Patient 50-74% Transfers Transfers: Sit to Stand;Lateral/Scoot Transfers Sit to Stand: Maximal Assistance - Patient 25-49% Lateral/Scoot Transfers: Moderate Assistance - Patient 50-74% Transfer (Assistive device): Rolling walker(slideboard for lateral scoot transfer) Locomotion  Gait Ambulation: No  Trunk/Postural Assessment  Cervical Assessment Cervical Assessment: Exceptions to WFL(forward head posture) Thoracic Assessment Thoracic Assessment: Exceptions to WFL(rounded shoulders) Lumbar Assessment Lumbar Assessment: Exceptions to WFL(posterior pelvic tilt in sitting) Postural Control Postural Control: Within Functional Limits  Balance Balance Balance Assessed: Yes Static Sitting Balance Static Sitting - Level of Assistance: 5: Stand by assistance Dynamic Sitting Balance Dynamic Sitting - Level of Assistance: 5: Stand by assistance Static Standing Balance Static Standing - Level of Assistance: 3: Mod assist Dynamic Standing Balance Dynamic Standing - Level of Assistance: 2: Max assist Extremity Assessment  RLE Assessment RLE Assessment: Exceptions to [facility] General Strength Comments: generalized weakness and poor muscular endurance, grossly 3-/5 throughout LLE Assessment LLE Assessment: Exceptions to [facility] General Strength Comments: generalized weakness and poor muscular endurance, grossly 3-/5 throughout    Refer to Care Plan for Long Term Goals  Recommendations for other services: Neuropsych  Discharge Criteria: Patient will be discharged from PT if patient refuses  treatment 3 consecutive times without medical reason, if treatment goals not met, if there is a change in medical status, if patient makes no progress towards goals or if patient is discharged from hospital.  The above assessment, treatment plan, treatment alternatives and goals were discussed and mutually agreed upon: by patient  Netta Corrigan, PT, DPT, CSRS 07/23/2019, 10:47 AM

## 2019-07-23 NOTE — Progress Notes (Signed)
[place] PHYSICAL MEDICINE & REHABILITATION PROGRESS NOTE   Subjective/Complaints: Was a little restless last night but sleep better than it has been previously. Happy to be on rehab  ROS: Patient denies fever, rash, sore throat, blurred vision, nausea, vomiting, diarrhea, cough, shortness of breath or chest pain, joint or back pain, headache, or mood change.    Objective:   No results found. Recent Labs    07/23/19 0501  WBC 14.2*  HGB 12.6  HCT 38.8  PLT 168   Recent Labs    07/23/19 0501  NA 140  K 3.3*  CL 103  CO2 25  GLUCOSE 111*  BUN 14  CREATININE 0.39*  CALCIUM 8.8*    Intake/Output Summary (Last 24 hours) at 07/23/2019 0758 Last data filed at 07/22/2019 1819 Gross per 24 hour  Intake 240 ml  Output -  Net 240 ml     Physical Exam: Vital Signs Blood pressure (!) 146/88, pulse 99, temperature 98.2 F (36.8 C), temperature source Oral, resp. rate 15, height 5\' 7"  (1.702 m), weight 94.4 kg, SpO2 100 %.  General: Alert and oriented x 3, No apparent distress HEENT: Head is normocephalic, atraumatic, PERRLA, EOMI, sclera anicteric, oral mucosa pink and moist, dentition intact, ext ear canals clear,  Neck: Supple without JVD or lymphadenopathy Heart: tachy around 100, No murmurs rubs or gallops Chest: CTA bilaterally without wheezes, rales, or rhonchi; no distress Abdomen: Soft, non-tender, non-distended, bowel sounds positive. Extremities: No clubbing, cyanosis, or edema. Pulses are 2+ Skin: Clean and intact without signs of breakdown, feet dry.  Neuro: Pt is cognitively appropriate with normal insight, memory, and awareness. Cranial nerves 2-12 are intact. Sensory exam is normal. Reflexes are 1+ in all 4's. Fine motor coordination is intact. No tremors. Motor function is grossly 4/5 UE, 2/5 HF, 3/5 KE and 4/5 ADF/PF.  Musculoskeletal: functional ROM. No obvious areas of pain Psych: Pt's affect is appropriate. Pt is cooperative       Assessment/Plan: 1. Functional deficits secondary to debility after COVID which require 3+ hours per day of interdisciplinary therapy in a comprehensive inpatient rehab setting.  Physiatrist is providing close team supervision and 24 hour management of active medical problems listed below.  Physiatrist and rehab team continue to assess barriers to discharge/monitor patient progress toward functional and medical goals  Care Tool:  Bathing              Bathing assist       Upper Body Dressing/Undressing Upper body dressing   What is the patient wearing?: Hospital gown only    Upper body assist Assist Level: Minimal Assistance - Patient > 75%    Lower Body Dressing/Undressing Lower body dressing      What is the patient wearing?: Underwear/pull up     Lower body assist Assist for lower body dressing: Minimal Assistance - Patient > 75%     Toileting Toileting    Toileting assist Assist for toileting: Minimal Assistance - Patient > 75%     Transfers Chair/bed transfer  Transfers assist  Chair/bed transfer activity did not occur: Safety/medical concerns        Locomotion Ambulation   Ambulation assist              Walk 10 feet activity   Assist           Walk 50 feet activity   Assist           Walk 150 feet activity   Assist  Walk 10 feet on uneven surface  activity   Assist           Wheelchair     Assist               Wheelchair 50 feet with 2 turns activity    Assist            Wheelchair 150 feet activity     Assist          Blood pressure (!) 146/88, pulse 99, temperature 98.2 F (36.8 C), temperature source Oral, resp. rate 15, height 5\' 7"  (1.702 m), weight 94.4 kg, SpO2 100 %.  Medical Problem List and Plan: 1. Impaired mobility and ADLs secondary to COVID-19  -Patient is beginning CIR therapies today including PT and OT  2.  Antithrombotics: -DVT/anticoagulation:Pharmaceutical:Lovenox -antiplatelet therapy: N/A 3.Chronic LBP/Pain Management:Currently 7/10. Oxycodone 5mg  prn. Apply heating pads 3 times per day PRN for low back pain. Continue lidocaine patch. Was on Oxycodone 15mg  PRN PTA.  4. Mood:LCSW to follow for evaluation and support. Team to provide ego support. -antipsychotic agents: N/A 5. Neuropsych: This patientiscapable of making decisions on herown behalf. 6. Skin/Wound Care:Routine pressure relief measures. 7. Fluids/Electrolytes/Nutrition:encourage PO  -I personally reviewed the patient's labs today.    -mild hypokalemia-=--replete  -add supplements for low albumin 8. COPD: Has been on IV solumedrol --->prednisone taper 9. T2DM with neuropathy: Continue Lantus 30 units daily.   -fair control thus far  -SSI 10. H/o depression: Monitor for now--not on any meds at this time. Seems very motivated 11. HTN: Monitor BP tid--currently on metoprolol 25mg  BID 12. OSA: CPAP at nights with 2L oxygen bled in.  13. Insomnia: improved last noc  -on trazodone 300mg  at home  -will schedule 50mg  tonight (as opposed to prn) 14. Anxiety: Xanax 0.125mg  BID PRN. Was on 1mg  BID PTA.  15. HLD: Continue atorvastatin 10mg  HS. 16. Leukocytosis d/t steroids      LOS: 1 days A FACE TO FACE EVALUATION WAS PERFORMED  Meredith Staggers 07/23/2019, 7:58 AM

## 2019-07-23 NOTE — Progress Notes (Addendum)
Physical Therapy Session Note  Patient Details  Name: Vanessa Mack MRN: [id number] Date of Birth: 13-Jan-1951  Today's Date: 07/23/2019 PT Individual Time: 1040-1141 PT Individual Time Calculation (min): 61 min   Short Term Goals: Week 1:  PT Short Term Goal 1 (Week 1): Pt will perform bed mobility with min assist PT Short Term Goal 2 (Week 1): Pt will perform bed<>chair transfer with mod assist PT Short Term Goal 3 (Week 1): Pt will initiate gait training  Skilled Therapeutic Interventions/Progress Updates:  Pt received in w/c & agreeable to tx. Transported pt to gym via w/c dependent assist. Pt completes slide board transfer w/c>mat table on R with min assist initially but requiring mod assist at the end. Pt requires frequent rest breaks during transfer 2/2 feeling "weak" and therapist provides cuing for head/hips relationship.  Pt transferred sit>R sidelying with supervision & poor eccentric control and rolled to supine but pt unable to tolerate 2/2 increased back & shoulder pain. Pt transferred R sidelying>sitting with max assist to upright trunk as pt unable despite cuing for technique. Pt reports decreased back/shoulder once sitting EOM. While sitting EOM pt performed BLE long arc quads & seated hip flexion with 3# ankle weights with therapist providing cuing for technique & pt with significant BLE hip flexor weakness. Pt then transfers back to w/c on L via slide board with min assist initially but pt fatiguing half way through transfer & ultimately requiring +2 assist to completely scoot to w/c. Pt returned to room & left in w/c with chair alarm donned & all needs at hand.  Therapy Documentation Precautions:  Precautions Precautions: Fall, Other (comment) Precaution Comments: monitor HR, SpO2 Restrictions Weight Bearing Restrictions: No   Vital Signs: Pt on room air during session & SpO2 remained >90% throughout. HR elevated throughout session, 116-124 bpm   Pain: Pt reports  6/10 chronic pain in back & neck but reports she's premedicated. PT asked PA (Pam) to order pt a k-pad as pt reports she uses a heating pad at home.   Therapy/Group: Individual Therapy  Waunita Schooner 07/23/2019, 11:52 AM

## 2019-07-23 NOTE — Progress Notes (Signed)
Pt states she used to wear CPAP at home but hasnt worn one in over a year.  Pt does not wish to wear CPAP. No distress noted.

## 2019-07-24 ENCOUNTER — Inpatient Hospital Stay (HOSPITAL_COMMUNITY): Payer: Medicare Other | Admitting: Physical Therapy

## 2019-07-24 ENCOUNTER — Inpatient Hospital Stay (HOSPITAL_COMMUNITY): Payer: Medicare Other | Admitting: Occupational Therapy

## 2019-07-24 ENCOUNTER — Inpatient Hospital Stay (HOSPITAL_COMMUNITY): Payer: Medicare Other

## 2019-07-24 DIAGNOSIS — R5381 Other malaise: Secondary | ICD-10-CM | POA: Diagnosis not present

## 2019-07-24 DIAGNOSIS — G479 Sleep disorder, unspecified: Secondary | ICD-10-CM | POA: Diagnosis not present

## 2019-07-24 DIAGNOSIS — E119 Type 2 diabetes mellitus without complications: Secondary | ICD-10-CM | POA: Diagnosis not present

## 2019-07-24 DIAGNOSIS — E876 Hypokalemia: Secondary | ICD-10-CM | POA: Diagnosis not present

## 2019-07-24 LAB — GLUCOSE, CAPILLARY
Glucose-Capillary: 150 mg/dL — ABNORMAL HIGH (ref 70–99)
Glucose-Capillary: 109 mg/dL — ABNORMAL HIGH (ref 70–99)
Glucose-Capillary: 200 mg/dL — ABNORMAL HIGH (ref 70–99)

## 2019-07-24 NOTE — Progress Notes (Signed)
Occupational Therapy Session Note  Patient Details  Name: Vanessa Mack MRN: [id number] Date of Birth: 26-Jun-1951  Today's Date: 07/24/2019 OT Individual Time: 3086-5784 OT Individual Time Calculation (min): 73 min    Short Term Goals: Week 1:  OT Short Term Goal 1 (Week 1): Pt will perform toilet transfer with mod A OT Short Term Goal 2 (Week 1): Pt will thread LB clothing with mod A sit to stand OT Short Term Goal 3 (Week 1): Pt will perform sit to stand with mod A consistently  Skilled Therapeutic Interventions/Progress Updates:    Pt greeted semi-reclined in bed and agreeable to OT treatment session. Pt completed sup>sit with min A. Pt reported need to have a BM. BSC placed beside bed and raised up for ease of transfer. Stedy used with Max+2 to stand from bed. Stedy transfer onto [facility]. Pt needed assistance with clothing management, then was able to have BM. Sit><stand from [facility] in [place] with max+2. Pt tolerated standing long enough for total A peri-care. Pt able to tolerate perched on stedy seat at the sink while brushing her teeth, washing UB, and donning clean shirt. Pt able to stand from perched stedy seat with mod A of 1. Worked on LB dressing from wc with max A. Stedy used again for sit<>stand with max +2, then therapists pulled up pants. OT assisted with hair washing task using wc wash basin. Pt able to hold wash basin around neck with string while OT washed hair. Worked on BUE strength/endurance with hair drying task. Pt tolerated drying hair for 1 minute intervals and needed guided A to bring L UE up to her head. Pt very appreciate of clean hair! OT braided pt's hair to her preference and pt left seated in wc with call bell in reach and needs met.  Therapy Documentation Precautions:  Precautions Precautions: Fall, Other (comment) Precaution Comments: monitor HR, SpO2 Restrictions Weight Bearing Restrictions: No Pain: Denies pain  Therapy/Group: Individual  Therapy  Valma Cava 07/24/2019, 9:46 AM

## 2019-07-24 NOTE — Progress Notes (Signed)
PHYSICAL MEDICINE & REHABILITATION PROGRESS NOTE   Subjective/Complaints: Had a good night. Very thankful to be on rehab. Feels as if she's making gains daily  ROS: Patient denies fever, rash, sore throat, blurred vision, nausea, vomiting, diarrhea, cough, shortness of breath or chest pain, joint or back pain, headache, or mood change.     Objective:   No results found. Recent Labs    07/23/19 0501  WBC 14.2*  HGB 12.6  HCT 38.8  PLT 168   Recent Labs    07/23/19 0501  NA 140  K 3.3*  CL 103  CO2 25  GLUCOSE 111*  BUN 14  CREATININE 0.39*  CALCIUM 8.8*    Intake/Output Summary (Last 24 hours) at 07/24/2019 1029 Last data filed at 07/24/2019 0700 Gross per 24 hour  Intake 360 ml  Output -  Net 360 ml     Physical Exam: Vital Signs Blood pressure 135/78, pulse 89, temperature 98.1 F (36.7 C), temperature source Oral, resp. rate 18, height 5\' 7"  (1.702 m), weight 94.4 kg, SpO2 98 %.  Constitutional: No distress . Vital signs reviewed. HEENT: EOMI, oral membranes moist Neck: supple Cardiovascular: RRR without murmur. No JVD    Respiratory: CTA Bilaterally without wheezes or rales. Normal effort    GI: BS +, non-tender, non-distended . Extremities: No clubbing, cyanosis, or edema. Pulses are 2+ Skin: Clean and intact without signs of breakdown, feet dry.  Neuro: Pt is cognitively appropriate with normal insight, memory, and awareness. Cranial nerves 2-12 are intact. Sensory exam is normal. Reflexes are 1+ in all 4's. Fine motor coordination is intact. No tremors. Motor function is grossly 4/5 UE, 3/5 HF, 3+/5 KE and 4/5 ADF/PF.  Musculoskeletal: functional ROM. No obvious areas of pain Psych: very pleasant      Assessment/Plan: 1. Functional deficits secondary to debility after COVID which require 3+ hours per day of interdisciplinary therapy in a comprehensive inpatient rehab setting.  Physiatrist is providing close team supervision and 24 hour  management of active medical problems listed below.  Physiatrist and rehab team continue to assess barriers to discharge/monitor patient progress toward functional and medical goals  Care Tool:  Bathing    Body parts bathed by patient: Right arm, Left arm, Chest, Abdomen, Face   Body parts bathed by helper: Front perineal area, Buttocks, Right upper leg, Left upper leg, Right lower leg, Left lower leg     Bathing assist Assist Level: Maximal Assistance - Patient 24 - 49%     Upper Body Dressing/Undressing Upper body dressing   What is the patient wearing?: Pull over shirt    Upper body assist Assist Level: Minimal Assistance - Patient > 75%    Lower Body Dressing/Undressing Lower body dressing      What is the patient wearing?: Underwear/pull up, Pants     Lower body assist Assist for lower body dressing: Maximal Assistance - Patient 25 - 49%     Toileting Toileting    Toileting assist Assist for toileting: Total Assistance - Patient < 25%     Transfers Chair/bed transfer  Transfers assist  Chair/bed transfer activity did not occur: Safety/medical concerns  Chair/bed transfer assist level: Maximal Assistance - Patient 25 - 49%(slide board)     Locomotion Ambulation   Ambulation assist   Ambulation activity did not occur: Safety/medical concerns          Walk 10 feet activity   Assist  Walk 10 feet activity did not occur: Safety/medical concerns  Walk 50 feet activity   Assist Walk 50 feet with 2 turns activity did not occur: Safety/medical concerns         Walk 150 feet activity   Assist Walk 150 feet activity did not occur: Safety/medical concerns         Walk 10 feet on uneven surface  activity   Assist Walk 10 feet on uneven surfaces activity did not occur: Safety/medical concerns         Wheelchair     Assist Will patient use wheelchair at discharge?: Yes(tbd) Type of Wheelchair: Manual Wheelchair  activity did not occur: Safety/medical concerns         Wheelchair 50 feet with 2 turns activity    Assist    Wheelchair 50 feet with 2 turns activity did not occur: Safety/medical concerns       Wheelchair 150 feet activity     Assist  Wheelchair 150 feet activity did not occur: Safety/medical concerns       Blood pressure 135/78, pulse 89, temperature 98.1 F (36.7 C), temperature source Oral, resp. rate 18, height 5\' 7"  (1.702 m), weight 94.4 kg, SpO2 98 %.  Medical Problem List and Plan: 1. Impaired mobility and ADLs secondary to COVID-19  --Continue CIR therapies including PT, OT   2. Antithrombotics: -DVT/anticoagulation:Pharmaceutical:Lovenox -antiplatelet therapy: N/A 3.Chronic LBP/Pain Management:Currently 7/10. Oxycodone 5mg  prn. Apply heating pads 3 times per day PRN for low back pain. Continue lidocaine patch. Was on Oxycodone 15mg  PRN PTA.  4. Mood:LCSW to follow for evaluation and support. Team to provide ego support. -antipsychotic agents: N/A 5. Neuropsych: This patientiscapable of making decisions on herown behalf. 6. Skin/Wound Care:Routine pressure relief measures. 7. Fluids/Electrolytes/Nutrition:encourage PO  -I personally reviewed the patient's labs today.    -mild hypokalemia-=--repleting  -protein supplements for low albumin 8. COPD: Has been on IV solumedrol --->prednisone taper 9. T2DM with neuropathy: Continue Lantus 30 units daily.   -fair to borderline control thus far  -SSI for coverage 10. H/o depression: Monitor for now--not on any meds at this time. Seems very motivated 11. HTN: Monitor BP tid--currently on metoprolol 25mg  BID 12. OSA: CPAP at nights with 2L oxygen bled in.  13. Insomnia: improved last noc  -on trazodone 300mg  at home  - scheduled 50mg  trazodone at HS with good results 14. Anxiety: Xanax 0.125mg  BID PRN. Was on 1mg  BID PTA.  15. HLD: Continue atorvastatin 10mg  HS. 16.  Leukocytosis d/t steroids      LOS: 2 days A FACE TO FACE EVALUATION WAS PERFORMED  Meredith Staggers 07/24/2019, 10:29 AM

## 2019-07-24 NOTE — Progress Notes (Addendum)
Physical Therapy Session Note  Patient Details  Name: Vanessa Mack MRN: [id number] Date of Birth: 12-Jul-1951  Today's Date: 07/24/2019 PT Individual Time: 1445-1510 PT Individual Time Calculation (min): 25 min   Short Term Goals: Week 1:  PT Short Term Goal 1 (Week 1): Pt will perform bed mobility with min assist PT Short Term Goal 2 (Week 1): Pt will perform bed<>chair transfer with mod assist PT Short Term Goal 3 (Week 1): Pt will initiate gait training Week 2:    Week 3:     Skilled Therapeutic Interventions/Progress Updates:    PAIN back and knees 6/10, pt states she received pain meds just prior to session, rest provided as needed  Pt initially supine in bed.  Supine to sit w/mod assist.  Scoots in sitting w/cga, additional time.  STS in stedy w/mod assist from elevated bed.  Once in stedy, performed STS using 2pillows to build up sitting surface and UE support w/min assist, repeated 5 sets of 3 w/2-3 min rest between efforts, HR 130-137, 02sats consistently 97% following each set.  Pt enjoyed standing and "the feel of weight on my legs".    Transferred to wc using STEDY and min assist to control stand to sit due to decreased eccentric control.   Pt transported to gym for energy conservation. Cardiovascular conditioning performed using Kinetron intervals of 83min on/29min off X 6 for total of 12 min.   Pt stated she found this challenging but not exhausting.   Pt transported to room and left OOB in wc w/needs in reach, plan discussed w/patient and next therapist for pt to remain OOB in chair until next session.  Pt agreed.  Pt very cooperative and motivated w/very positive outlook.   Therapy Documentation Precautions:  Precautions Precautions: Fall, Other (comment) Precaution Comments: monitor HR, SpO2 Restrictions Weight Bearing Restrictions: No    Therapy/Group: Individual Therapy  Callie Fielding, [place] 07/24/2019, 4:18 PM

## 2019-07-24 NOTE — Progress Notes (Signed)
RT placed pt on CPAP dream station for the night on auto titrate max 16 min 8 w/4 Lpm bled into the system. Pt respiratory status is stable at this time on CPAP. RT will continue to monitor.

## 2019-07-24 NOTE — Plan of Care (Signed)
  Problem: RH BOWEL ELIMINATION Goal: RH STG MANAGE BOWEL WITH ASSISTANCE Description: STG Manage Bowel with Min Assistance. Outcome: Progressing   Problem: RH BLADDER ELIMINATION Goal: RH STG MANAGE BLADDER WITH ASSISTANCE Description: STG Manage Bladder With Min Assistance Outcome: Progressing   Problem: RH SKIN INTEGRITY Goal: RH STG SKIN FREE OF INFECTION/BREAKDOWN Description: No new breakdown with min assist  Outcome: Progressing Goal: RH STG ABLE TO PERFORM INCISION/WOUND CARE W/ASSISTANCE Description: STG Able To Perform Incision/Wound Care With Mod Assistance. Outcome: Progressing   Problem: RH SAFETY Goal: RH STG ADHERE TO SAFETY PRECAUTIONS W/ASSISTANCE/DEVICE Description: STG Adhere to Safety Precautions With min Assistance/Device. Outcome: Progressing   Problem: RH PAIN MANAGEMENT Goal: RH STG PAIN MANAGED AT OR BELOW PT'S PAIN GOAL Description: <3 out of 10.  Outcome: Progressing

## 2019-07-24 NOTE — Progress Notes (Signed)
Physical Therapy Session Note  Patient Details  Name: Vanessa Mack MRN: [id number] Date of Birth: 01-11-1951  Today's Date: 07/24/2019 PT Individual Time: [id number] AND 1445-1510 PT Individual Time Calculation (min): 40 min AND 25 min  Short Term Goals: Week 1:  PT Short Term Goal 1 (Week 1): Pt will perform bed mobility with min assist PT Short Term Goal 2 (Week 1): Pt will perform bed<>chair transfer with mod assist PT Short Term Goal 3 (Week 1): Pt will initiate gait training  Skilled Therapeutic Interventions/Progress Updates:   Session 1:  Pt in w/c and agreeable to therapy, reports soreness from sitting for so long, eager to get off bottom. Pt self-propelled w/c to therapy gym w/ supervision using BUEs to work on endurance, needed 2-3 rest breaks along the way. Worked on sit<>stands in stedy w/ max assist to stand and verbal and tactile cues to reach full upright. Pt able to reach full upright w/o knees being blocked on stedy, no knee buckling noted. Maintained static stance for 30 sec x4 reps. Worked on BUE strengthening during seated rest breaks including shoulder press 2# dowel 3x5 and trunk rotation w/ 3kg ball 3x5. Returned to room total assist via w/c, stedy transfer to EOB, ended session in supine and all needs in reach.   Session 2:  Pt in w/c and agreeable to therapy, denies pain but reports increased BLE soreness. Performed BLE strengthening exercises as listed below in seated and in supine. Stedy transfer to EOB w/ max assist sit>stand. Ended session in supine, all needs in reach. Heat applied to back for pain/soreness relief.   BLE strengthening exercises: -LAQs 3x10 -knee marches 3x10 -adduction squeeze 1x10 -isometric abduction 1x10   Therapy Documentation Precautions:  Precautions Precautions: Fall, Other (comment) Precaution Comments: monitor HR, SpO2 Restrictions Weight Bearing Restrictions: No Pain: Pain Assessment Pain Scale: 0-10 Pain Score: 8    Therapy/Group: Individual Therapy  [name] K Ronnel Zuercher 07/24/2019, 11:54 AM

## 2019-07-25 ENCOUNTER — Inpatient Hospital Stay (HOSPITAL_COMMUNITY): Payer: Medicare Other

## 2019-07-25 ENCOUNTER — Inpatient Hospital Stay (HOSPITAL_COMMUNITY): Payer: Medicare Other | Admitting: Physical Therapy

## 2019-07-25 ENCOUNTER — Inpatient Hospital Stay (HOSPITAL_COMMUNITY): Payer: Medicare Other | Admitting: Occupational Therapy

## 2019-07-25 DIAGNOSIS — R5381 Other malaise: Secondary | ICD-10-CM | POA: Diagnosis not present

## 2019-07-25 DIAGNOSIS — G479 Sleep disorder, unspecified: Secondary | ICD-10-CM | POA: Diagnosis not present

## 2019-07-25 DIAGNOSIS — E876 Hypokalemia: Secondary | ICD-10-CM | POA: Diagnosis not present

## 2019-07-25 DIAGNOSIS — E119 Type 2 diabetes mellitus without complications: Secondary | ICD-10-CM | POA: Diagnosis not present

## 2019-07-25 LAB — GLUCOSE, CAPILLARY
Glucose-Capillary: 151 mg/dL — ABNORMAL HIGH (ref 70–99)
Glucose-Capillary: 139 mg/dL — ABNORMAL HIGH (ref 70–99)
Glucose-Capillary: 185 mg/dL — ABNORMAL HIGH (ref 70–99)
Glucose-Capillary: 118 mg/dL — ABNORMAL HIGH (ref 70–99)
Glucose-Capillary: 133 mg/dL — ABNORMAL HIGH (ref 70–99)
Glucose-Capillary: 202 mg/dL — ABNORMAL HIGH (ref 70–99)

## 2019-07-25 MED ORDER — GLIMEPIRIDE 2 MG PO TABS
1.0000 mg | ORAL_TABLET | Freq: Every day | ORAL | Status: DC
  Administered 2019-07-25 – 2019-08-15 (×22): 1 mg via ORAL
  Filled 2019-07-25 (×22): qty 1

## 2019-07-25 NOTE — Progress Notes (Signed)
Physical Therapy Session Note  Patient Details  Name: Vanessa Mack MRN: [id number] Date of Birth: [birth year]  Today's Date: 07/25/2019 PT Individual Time: [id number] PT Individual Time Calculation (min): 55 min   Short Term Goals: Week 1:  PT Short Term Goal 1 (Week 1): Pt will perform bed mobility with min assist PT Short Term Goal 2 (Week 1): Pt will perform bed<>chair transfer with mod assist PT Short Term Goal 3 (Week 1): Pt will initiate gait training  Skilled Therapeutic Interventions/Progress Updates:  Pt received in bed & agreeable to tx. Pt attempts to transfer to sitting EOB & does so with extra time, hospital bed features, and close supervision. Pt transfers sit>stand from elevated EOB with max assist & stedy lift. Pt propels w/c room>dayroom with BUE & supervision, without requiring rest breaks on this date. Provided pt with w/c gloves to prevent skin breakdown. Pt requires +2 for sit>stand from w/c into stedy lift. Educated pt on hip extensor/glute weakness & in stedy pt performed mini squats with cuing for technique & task focusing on glute & hip extensor strengthening with pt only able to perform 5 reps before significant fatigue. Pt performed seated BLE hamstring curls with orange therapist providing resistance with orange theraband, 2 sets x 10 reps. Pt propels w/c almost halfway back to room with BUE & supervision & 1 rest break then therapist assisting pt back to room. Pt left in w/c with chair alarm donned & all needs at hand.  HR ranged from 97-138 bpm & requires significantly extra time to decrease after each exercise. Pt on room air during session & SpO2 >90% throughout.   Provided pt with roho cushion to reduce skin breakdown as pt already has sacral wound.    Therapy Documentation Precautions:  Precautions Precautions: Fall, Other (comment) Precaution Comments: monitor HR, SpO2 Restrictions Weight Bearing Restrictions: No  Pain: Pt reports 6/10 pain in back  & knees & soreness "down to the bone" - pt declines asking for pain meds until end of session & RN made aware.   Therapy/Group: Individual Therapy  Waunita Schooner 07/25/2019, 2:05 PM

## 2019-07-25 NOTE — Plan of Care (Signed)
  Problem: RH BOWEL ELIMINATION Goal: RH STG MANAGE BOWEL WITH ASSISTANCE Description: STG Manage Bowel with Min Assistance. Outcome: Progressing   Problem: RH BLADDER ELIMINATION Goal: RH STG MANAGE BLADDER WITH ASSISTANCE Description: STG Manage Bladder With Min Assistance Outcome: Progressing   Problem: RH SKIN INTEGRITY Goal: RH STG SKIN FREE OF INFECTION/BREAKDOWN Description: No new breakdown with min assist  Outcome: Progressing Goal: RH STG ABLE TO PERFORM INCISION/WOUND CARE W/ASSISTANCE Description: STG Able To Perform Incision/Wound Care With Mod Assistance. Outcome: Progressing   Problem: RH SAFETY Goal: RH STG ADHERE TO SAFETY PRECAUTIONS W/ASSISTANCE/DEVICE Description: STG Adhere to Safety Precautions With min Assistance/Device. Outcome: Progressing   Problem: RH PAIN MANAGEMENT Goal: RH STG PAIN MANAGED AT OR BELOW PT'S PAIN GOAL Description: <3 out of 10.  Outcome: Progressing

## 2019-07-25 NOTE — Progress Notes (Signed)
[facility] PHYSICAL MEDICINE & REHABILITATION PROGRESS NOTE   Subjective/Complaints: Feeling nauseas this morning. Ate breakfast but became nauseas afterwards. Denies belly pain. Hasn't received anything for nausea yet  ROS: Patient denies fever, rash, sore throat, blurred vision,   vomiting, diarrhea, cough, shortness of breath or chest pain, joint or back pain, headache, or mood change.    Objective:   No results found. Recent Labs    07/23/19 0501  WBC 14.2*  HGB 12.6  HCT 38.8  PLT 168   Recent Labs    07/23/19 0501  NA 140  K 3.3*  CL 103  CO2 25  GLUCOSE 111*  BUN 14  CREATININE 0.39*  CALCIUM 8.8*    Intake/Output Summary (Last 24 hours) at 07/25/2019 0854 Last data filed at 07/25/2019 0800 Gross per 24 hour  Intake 358 ml  Output -  Net 358 ml     Physical Exam: Vital Signs Blood pressure 125/75, pulse 75, temperature 98.3 F (36.8 C), temperature source Oral, resp. rate 18, height 5\' 7"  (1.702 m), weight 94.4 kg, SpO2 96 %.  Constitutional: mild distress . Vital signs reviewed. HEENT: EOMI, oral membranes moist Neck: supple Cardiovascular: RRR without murmur. No JVD    Respiratory: CTA Bilaterally without wheezes or rales. Normal effort    GI: BS +, non-tender, non-distended  Extremities: No clubbing, cyanosis, or edema. Pulses are 2+ Skin: Clean and intact without signs of breakdown, feet dry.  Neuro: Pt is cognitively appropriate with normal insight, memory, and awareness. Cranial nerves 2-12 are intact. Sensory exam is normal. Reflexes are 1+ in all 4's. Fine motor coordination is intact. No tremors. Motor function is grossly 4/5 UE, 3/5 HF, 3+/5 KE and 4/5 ADF/PF.--stable neuro exam  Musculoskeletal: functional ROM. No obvious areas of pain Psych: very pleasant      Assessment/Plan: 1. Functional deficits secondary to debility after COVID which require 3+ hours per day of interdisciplinary therapy in a comprehensive inpatient rehab  setting.  Physiatrist is providing close team supervision and 24 hour management of active medical problems listed below.  Physiatrist and rehab team continue to assess barriers to discharge/monitor patient progress toward functional and medical goals  Care Tool:  Bathing    Body parts bathed by patient: Right arm, Left arm, Chest, Abdomen, Face   Body parts bathed by helper: Front perineal area, Buttocks, Right upper leg, Left upper leg, Right lower leg, Left lower leg     Bathing assist Assist Level: Maximal Assistance - Patient 24 - 49%     Upper Body Dressing/Undressing Upper body dressing   What is the patient wearing?: Pull over shirt    Upper body assist Assist Level: Minimal Assistance - Patient > 75%    Lower Body Dressing/Undressing Lower body dressing      What is the patient wearing?: Underwear/pull up, Pants     Lower body assist Assist for lower body dressing: Moderate Assistance - Patient 50 - 74%     Toileting Toileting    Toileting assist Assist for toileting: Maximal Assistance - Patient 25 - 49%     Transfers Chair/bed transfer  Transfers assist  Chair/bed transfer activity did not occur: Safety/medical concerns  Chair/bed transfer assist level: Dependent - Patient 0%     Locomotion Ambulation   Ambulation assist   Ambulation activity did not occur: Safety/medical concerns          Walk 10 feet activity   Assist  Walk 10 feet activity did not occur: Safety/medical  concerns        Walk 50 feet activity   Assist Walk 50 feet with 2 turns activity did not occur: Safety/medical concerns         Walk 150 feet activity   Assist Walk 150 feet activity did not occur: Safety/medical concerns         Walk 10 feet on uneven surface  activity   Assist Walk 10 feet on uneven surfaces activity did not occur: Safety/medical concerns         Wheelchair     Assist Will patient use wheelchair at discharge?:  Yes(tbd) Type of Wheelchair: Manual Wheelchair activity did not occur: Safety/medical concerns  Wheelchair assist level: Supervision/Verbal cueing Max wheelchair distance: 96'    Wheelchair 50 feet with 2 turns activity    Assist    Wheelchair 50 feet with 2 turns activity did not occur: Safety/medical concerns   Assist Level: Supervision/Verbal cueing   Wheelchair 150 feet activity     Assist  Wheelchair 150 feet activity did not occur: Safety/medical concerns       Blood pressure 125/75, pulse 75, temperature 98.3 F (36.8 C), temperature source Oral, resp. rate 18, height 5\' 7"  (1.702 m), weight 94.4 kg, SpO2 96 %.  Medical Problem List and Plan: 1. Impaired mobility and ADLs secondary to COVID-19  --Continue CIR therapies including PT, OT   2. Antithrombotics: -DVT/anticoagulation:Pharmaceutical:Lovenox -antiplatelet therapy: N/A 3.Chronic LBP/Pain Management:Currently 7/10. Oxycodone 5mg  prn. Apply heating pads 3 times per day PRN for low back pain. Continue lidocaine patch. Was on Oxycodone 15mg  PRN PTA.  4. Mood:LCSW to follow for evaluation and support. Team to provide ego support. -antipsychotic agents: N/A 5. Neuropsych: This patientiscapable of making decisions on herown behalf. 6. Skin/Wound Care:Routine pressure relief measures. 7. Fluids/Electrolytes/Nutrition:encourage PO  -mild hypokalemia-continue supplement--recheck monday  -protein supplements for low albumin 8. COPD: Has been on IV solumedrol --->prednisone taper 9. T2DM with neuropathy: Continue Lantus 30 units daily.   -was on metformin xr at home pta  -sugars remain elevated  -begin amaryl 1mg  daily starting later this morning  -continue SSI for coverage 10. H/o depression: Monitor for now--not on any meds at this time. Seems very motivated 11. HTN: Monitor BP tid--currently on metoprolol 25mg  BID and controlled 12. OSA: CPAP at nights with 2L oxygen  bled in.  13. Insomnia: improved   -on trazodone 300mg  at home  - scheduled 50mg  trazodone at HS with good results 14. Anxiety: Xanax 0.125mg  BID PRN. Was on 1mg  BID PTA.  15. HLD: Continue atorvastatin 10mg  HS. 16. Leukocytosis d/t steroids  -steroid taper  -cbc Monday 17. Nausea: may have been something from breakfast which disagreed with her  -had large bm yesterday  -zofran prn  -observe      LOS: 3 days A FACE TO FACE EVALUATION WAS PERFORMED  Meredith Staggers 07/25/2019, 8:54 AM

## 2019-07-25 NOTE — Care Management (Signed)
  Inpatient Rehabilitation Center Individual Statement of Services  Patient Name:  Vanessa Mack  Date:  07/25/2019  Welcome to the [facility].  Our goal is to provide you with an individualized program based on your diagnosis and situation, designed to meet your specific needs.  With this comprehensive rehabilitation program, you will be expected to participate in at least 3 hours of rehabilitation therapies Monday-Friday, with modified therapy programming on the weekends.  Your rehabilitation program will include the following services:  Physical Therapy (PT), Occupational Therapy (OT), 24 hour per day rehabilitation nursing, Therapeutic Recreaction (TR), Neuropsychology, Case Management (Social Worker), Rehabilitation Medicine, Nutrition Services and Pharmacy Services  Weekly team conferences will be held on Tuesdays to discuss your progress.  Your Social Worker will talk with you frequently to get your input and to update you on team discussions.  Team conferences with you and your family in attendance may also be held.  Expected length of stay: 3 weeks   Overall anticipated outcome: supervision to CGA  Depending on your progress and recovery, your program may change. Your Social Worker will coordinate services and will keep you informed of any changes. Your Social Worker's name and contact numbers are listed  below.  The following services may also be recommended but are not provided by the [facility] will be made to provide these services after discharge if needed.  Arrangements include referral to agencies that provide these services.  Your insurance has been verified to be:  Medicare; Medicaid Your primary doctor is:  TBD  Pertinent information will be shared with your doctor and your insurance company.  Social Worker:   [place], [place] or ([phone]   Information discussed with and copy given to patient by: Lennart Pall, 07/25/2019, 4:30 PM

## 2019-07-25 NOTE — Progress Notes (Signed)
Social Work Assessment and Plan   Patient Details  Name: Vanessa Mack MRN: [id number] Date of Birth: 07-Aug-1951  Today's Date: 07/25/2019  Problem List:  Patient Active Problem List   Diagnosis Date Noted  . Physical debility 07/22/2019   Past Medical History:  Past Medical History:  Diagnosis Date  . 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 Chronic respiratory failure with hypoxia ([place])   . Colon adenomas   . COPD (chronic obstructive pulmonary disease) ([place])   . Cough with hemoptysis 10/2018  . COVID-19 05/31/2019  . Diverticulosis   . DOE (dyspnea on exertion)   . Fibromyalgia   . GAD (generalized anxiety disorder)   . Glaucoma   . Hematuria   . HTN (hypertension)   . Impaired functional mobility, balance, gait, and endurance   . Intrinsic asthma   . Lumbar radiculopathy   . Major depressive disorder, recurrent episode, moderate ([place])   . Nonobstructive atherosclerosis of coronary artery   . OSA (obstructive sleep apnea)   . Polyneuropathy   . Prolonged Q-T interval on ECG   . Psoriatic arthritis ([place])   . PUD (peptic ulcer disease)   . Sepsis due to COVID-19 ([place]) 05/2019  . Stroke (cerebrum) ([place])   . T2DM (type 2 diabetes mellitus) ([place])   . Venous stasis of lower extremity    Past Surgical History:  Past Surgical History:  Procedure Laterality Date  . EXPLORATORY LAPAROTOMY    . TOTAL KNEE ARTHROPLASTY Right 09/2017   Social History:  has no history on file for tobacco, alcohol, and drug.  Family / Support Systems Marital Status: Divorced Patient Roles: Parent, Other (Comment)(grandparent) Children: son, [name] @ [phone];  son, Tama High @ [phone] Anticipated Caregiver: Sons Corene Cornea and [name]): plan is to stay with Corene Cornea Ability/Limitations of Caregiver: Min/Mod A  Family Dynamics: Pt notes very good relationship with sons and feels comfortable that they can provide any needed assistance.  Social History Preferred language:  English Religion: Other Cultural Background: NA Read: Yes Write: Yes Employment Status: Disabled Date Retired/Disabled/Unemployed: ~ 2006? per pt Legal History/Current Legal Issues: None Guardian/Conservator: None - per MD, pt is capable of making decisions on her own behalf.   Abuse/Neglect Abuse/Neglect Assessment Can Be Completed: Yes Physical Abuse: Denies Verbal Abuse: Denies Sexual Abuse: Denies Exploitation of patient/patient's resources: Denies Self-Neglect: Denies  Emotional Status Pt's affect, behavior and adjustment status: Pt sitting up in w/c and very pleasant, able to complete assessment interview without any difficulty.  Denies any significant emotional distress, however, does note h/o depression - may benefit from neuropsychology consult while here.  Will monitor and refer. Recent Psychosocial Issues: None Psychiatric History: As noted, h/o depression Substance Abuse History: None  Patient / Family Perceptions, Expectations & Goals Pt/Family understanding of illness & functional limitations: Pt and sons with good, general understanding of her level of debility post COVID-19 illness/ need for CIR. Premorbid pt/family roles/activities: Pt was independent overall but did have PCS aide M-F for ADLs and home management Anticipated changes in roles/activities/participation: Son, Corene Cornea, to assume primary caregiver support role. Pt/family expectations/goals: Pt hopeful she will regain her prior level of function, "But I know it's going to take awhile."  [employer]: Other (Comment)(PCS Aide) Premorbid Home Care/DME Agencies: None Transportation available at discharge: yes Resource referrals recommended: Neuropsychology  Discharge Planning Living Arrangements: Alone Support Systems: Children, Church/faith community Type of Residence: Private residence Insurance Resources: [employer] (specify county), [employer] Resources: Sports administrator, [place]  Financial Screen Referred: No Living Expenses: Rent Money Management: Patient Does the patient have any problems obtaining your medications?: No Home Management: Pt and PCS aide Patient/Family Preliminary Plans: Pt hope to return to her own apartment but does have option to stay with son.  Son, Corene Cornea, to be with her at either location. Social Work Anticipated Follow Up Needs: HH/OP Expected length of stay: 3 weeks  Clinical Impression Pleasant woman on CIR for debility/ Post COVID-19.  She has very good support from two sons with plans that one will stay with her at d/c.  She denies any significant emotional distress currently - will monitor.  Will assist with any support or d/c planning needs.  Jaymon Dudek 07/25/2019, 4:18 PM

## 2019-07-25 NOTE — Progress Notes (Signed)
Pt refused CPAP for the night and advised her to call if she changed her mind.

## 2019-07-25 NOTE — IPOC Note (Signed)
Overall Plan of Care [facility]) Patient Details Name: Vanessa Mack MRN: [id number] DOB: [birth year]  Admitting Diagnosis: Physical debility  Hospital Problems: Principal Problem:   Physical debility     Functional Problem List: Nursing Skin Integrity, Bladder, Bowel, Medication Management, Safety, Pain  PT Balance, Behavior, Motor, Endurance, Pain, Safety, Skin Integrity  OT Safety, Balance, Edema, Endurance, Motor, Pain  SLP    TR         Basic ADL's: OT Grooming, Bathing, Dressing, Toileting     Advanced  ADL's: OT       Transfers: PT Bed Mobility, Bed to Chair, Car, Manufacturing systems engineer, Metallurgist: PT Ambulation, Emergency planning/management officer, Stairs     Additional Impairments: OT Fuctional Use of Upper Extremity  SLP        TR      Anticipated Outcomes Item Anticipated Outcome  Self Feeding n/a  Swallowing      Basic self-care  supervision to min A  Toileting  contact guard   Bathroom Transfers contact guard  Bowel/Bladder  Pt will manage bowel and bladder with min assist  Transfers  supervision  Locomotion  supervision  Communication     Cognition     Pain  Pt will manage pain at 3 or less on a scale of 0-10.  Safety/Judgment  Pt will remain free of falls with injury while in rehab with min assist   Therapy Plan: PT Intensity: Minimum of 1-2 x/day ,45 to 90 minutes PT Frequency: 5 out of 7 days PT Duration Estimated Length of Stay: 3 weeks OT Intensity: Minimum of 1-2 x/day, 45 to 90 minutes OT Frequency: 5 out of 7 days OT Duration/Estimated Length of Stay: ~3 weeks     Due to the current state of emergency, patients may not be receiving their 3-hours of Medicare-mandated therapy.   Team Interventions: Nursing Interventions Patient/Family Education, Bladder Management, Bowel Management, Disease Management/Prevention, Medication Management, Pain Management, Discharge Planning, Skin Care/Wound Management  PT interventions  Ambulation/gait training, Balance/vestibular training, Cognitive remediation/compensation, Disease management/prevention, Functional mobility training, Patient/family education, Splinting/orthotics, Therapeutic Exercise, UE/LE Coordination activities, Therapeutic Activities, Discharge planning, Wheelchair propulsion/positioning, UE/LE Strength taining/ROM, Stair training, Neuromuscular re-education, DME/adaptive equipment instruction, Pain management  OT Interventions Balance/vestibular training, Discharge planning, Pain management, Self Care/advanced ADL retraining, Therapeutic Activities, UE/LE Coordination activities, Disease mangement/prevention, Functional mobility training, Patient/family education, Skin care/wound managment, Therapeutic Exercise, Community reintegration, DME/adaptive equipment instruction, Neuromuscular re-education, Psychosocial support, Splinting/orthotics, UE/LE Strength taining/ROM, Wheelchair propulsion/positioning  SLP Interventions    TR Interventions    SW/CM Interventions Discharge Planning, Psychosocial Support, Patient/Family Education   Barriers to Discharge MD  Medical stability  Nursing Medical stability, Incontinence    PT Medical stability, Decreased caregiver support increased HR with mobility  OT      SLP      SW       Team Discharge Planning: Destination: PT-  ,OT- Home , SLP-  Projected Follow-up: PT- , OT-  Home health OT, SLP-  Projected Equipment Needs: PT- , OT- To be determined, SLP-  Equipment Details: PT- , OT-  Patient/family involved in discharge planning: PT-  ,  OT-Patient, SLP-   MD ELOS: 21 days Medical Rehab Prognosis:  Excellent Assessment: The patient has been admitted for CIR therapies with the diagnosis of debility after COVID and prolonged hospital stay. The team will be addressing functional mobility, strength, stamina, balance, safety, adaptive techniques and equipment, self-care, bowel and bladder mgt, patient and caregiver  education,  stamina, breathing techniques, community reentry. Goals have been set at min assist to CGA with self-care and supervision for mobility.   Due to the current state of emergency, patients may not be receiving their 3 hours per day of Medicare-mandated therapy.    Meredith Staggers, MD, FAAPMR      See Team Conference Notes for weekly updates to the plan of care

## 2019-07-25 NOTE — Progress Notes (Signed)
RT placed pt on CPAP dream station for the night on auto titrate settings of max 12 min 6 w/6 Lpm bled into the system. Pt respiratory status is stable at this time. RT will continue to monitor.

## 2019-07-25 NOTE — Progress Notes (Signed)
Physical Therapy Session Note  Patient Details  Name: KATIANNE FENCL MRN: [id number] Date of Birth: 07-09-1951  Today's Date: 07/25/2019 PT Individual Time: [id number] PT Individual Time Calculation (min): 68 min   Short Term Goals: Week 1:  PT Short Term Goal 1 (Week 1): Pt will perform bed mobility with min assist PT Short Term Goal 2 (Week 1): Pt will perform bed<>chair transfer with mod assist PT Short Term Goal 3 (Week 1): Pt will initiate gait training Week 2:    Week 3:     Skilled Therapeutic Interventions/Progress Updates:     PAIN:  Legs and back 4/10 nursing provided pain meds just prior to rx.  Treatment to tolerance.  Treatment session focusing on cardiovascular conditioing and LE strengfthening to improve transfer abilities and as pregait activity.  wc propulsion 86ft w/bilat UE's for strengthening and endurance  STS in steady from wc w/mod assist of 2 and cues for momentum. Once in steady perfomed semi sit to stand x 6 w/supervision using grab bar Stood up to 73min w/supervision.  Standing activities incuded TKEs, mini squats, and one set of Semi sit to stand x 3 reps.  HR 100-125, 02sats 95-98 Semi sit to stand w/supervision, stand to sit in wc w/cga and cues for eccentric control.   Set up assist for use of kinetron. Kinetron 46min on/off intervals performed x  6 Reps at 90*/sec for LE strength and cardiovascular endurance, pt w/increased pace today and RPE 6-7/10 indicating appropriate level for conditioing.  Pt transported to room due to fatigue. STS in [place] w/mod assist of 2.  Transported to edge of bed.  Semisit to stand w/supervision using hand bar, seat removed and stand to sit on edge of bed w/cga and cues for controlled descent.  Sit to supine w/min assist for LE's. Pt left supine w/rails up x 3, alarm set, bed in lowest position, and needs in reach.  Pt reported increased bilat LE's mm soreness from increased activity, but felt it was "a good sore" and  not from overdoing it.  Activity tolerance is limited and frequent rest breaks needed for recovery, but this is improving.  Therapy Documentation Precautions:  Precautions Precautions: Fall, Other (comment) Precaution Comments: monitor HR, SpO2 Restrictions Weight Bearing Restrictions: No    Therapy/Group: Individual Therapy  Callie Fielding, [place] 07/25/2019, 12:07 PM

## 2019-07-25 NOTE — Progress Notes (Signed)
Occupational Therapy Session Note  Patient Details  Name: Vanessa Mack MRN: [id number] Date of Birth: 1951/04/25  Today's Date: 07/25/2019 OT Individual Time: 5525-8948 OT Individual Time Calculation (min): 75 min   Short Term Goals: Week 1:  OT Short Term Goal 1 (Week 1): Pt will perform toilet transfer with mod A OT Short Term Goal 2 (Week 1): Pt will thread LB clothing with mod A sit to stand OT Short Term Goal 3 (Week 1): Pt will perform sit to stand with mod A consistently  Skilled Therapeutic Interventions/Progress Updates:    Pt greeted semi-reclined in bed, pt reported some nausea this am. Per pt, nursing went to get nausea medicine. Pt agreeable to try to sit EOB. Pt needed min A to get EOB. Pt leaned side to side to help wash buttocks and peri-area. OT applied barrier cream to sore buttocks. Pt able to thread R LE pant with bending forward, but needed assist for LLE. Sit<>stand in [place] from raised bed with mod A. Pt reported need to go to the bathroom. Stedy used to transfer pt to [facility]. Pt voided bladder and had successful BM. Total A for peri-care and max A sit<>stand from high BSC. Pt then completed UB bathing and dressing sitting in wc at the sink with set-up A. OT provided spiritual and emotional encouragement with pt preferred gospel music. Pt left sitting in wc at end of session with chair alarm on, call bell in reach, and needs met.  Therapy Documentation Precautions:  Precautions Precautions: Fall, Other (comment) Precaution Comments: monitor HR, SpO2 Restrictions Weight Bearing Restrictions: No Pain: Pain Assessment Pain Scale: 0-10 Pain Score: 4  Pain Type: Acute pain Pain Location: Generalized Pain Orientation: Mid;Upper Pain Radiating Towards: neck and legs Pain Descriptors / Indicators: Aching(Soreness everywhere) Pain Frequency: Constant Pain Onset: On-going Patients Stated Pain Goal: 3 Pain Intervention(s): Repositioned   Therapy/Group: Individual  Therapy  Valma Cava 07/25/2019, 9:27 AM

## 2019-07-26 ENCOUNTER — Inpatient Hospital Stay (HOSPITAL_COMMUNITY): Payer: Medicare Other | Admitting: Physical Therapy

## 2019-07-26 ENCOUNTER — Inpatient Hospital Stay (HOSPITAL_COMMUNITY): Payer: Medicare Other | Admitting: Occupational Therapy

## 2019-07-26 DIAGNOSIS — E876 Hypokalemia: Secondary | ICD-10-CM | POA: Diagnosis not present

## 2019-07-26 DIAGNOSIS — R5381 Other malaise: Secondary | ICD-10-CM | POA: Diagnosis not present

## 2019-07-26 DIAGNOSIS — E119 Type 2 diabetes mellitus without complications: Secondary | ICD-10-CM | POA: Diagnosis not present

## 2019-07-26 DIAGNOSIS — G479 Sleep disorder, unspecified: Secondary | ICD-10-CM | POA: Diagnosis not present

## 2019-07-26 LAB — GLUCOSE, CAPILLARY
Glucose-Capillary: 113 mg/dL — ABNORMAL HIGH (ref 70–99)
Glucose-Capillary: 94 mg/dL (ref 70–99)
Glucose-Capillary: 108 mg/dL — ABNORMAL HIGH (ref 70–99)
Glucose-Capillary: 235 mg/dL — ABNORMAL HIGH (ref 70–99)

## 2019-07-26 NOTE — Progress Notes (Signed)
Pt refusing CPAP

## 2019-07-26 NOTE — Progress Notes (Signed)
[place] PHYSICAL MEDICINE & REHABILITATION PROGRESS NOTE   Subjective/Complaints: Nausea better. Ended up having a decent day. Refused cpap last night. Feels fine this morning  ROS: Patient denies fever, rash, sore throat, blurred vision, nausea, vomiting, diarrhea, cough, shortness of breath or chest pain, joint or back pain, headache, or mood change.   Objective:   No results found. No results for input(s): WBC, HGB, HCT, PLT in the last 72 hours. No results for input(s): NA, K, CL, CO2, GLUCOSE, BUN, CREATININE, CALCIUM in the last 72 hours.  Intake/Output Summary (Last 24 hours) at 07/26/2019 1024 Last data filed at 07/26/2019 0900 Gross per 24 hour  Intake 590 ml  Output -  Net 590 ml     Physical Exam: Vital Signs Blood pressure 113/86, pulse (!) 126, temperature 98.1 F (36.7 C), temperature source Oral, resp. rate 18, height 5\' 7"  (1.702 m), weight 94.4 kg, SpO2 96 %.  Constitutional: No distress . Vital signs reviewed. HEENT: EOMI, oral membranes moist Neck: supple Cardiovascular: RRR without murmur. No JVD    Respiratory: CTA Bilaterally without wheezes or rales. Normal effort    GI: BS +, non-tender, non-distended  Extremities: No clubbing, cyanosis, or edema. Pulses are 2+ Skin: Clean and intact without signs of breakdown, feet dry.  Neuro: Pt is cognitively appropriate with normal insight, memory, and awareness. Cranial nerves 2-12 are intact. Sensory exam is normal. Reflexes are 1+ in all 4's. Fine motor coordination is intact. No tremors. Motor function is grossly 4/5 UE, 3/5 HF, 3+/5 KE and 4/5 ADF/PF.--no changes on neuro exam  Musculoskeletal: functional ROM. No obvious areas of pain Psych: pleasant      Assessment/Plan: 1. Functional deficits secondary to debility after COVID which require 3+ hours per day of interdisciplinary therapy in a comprehensive inpatient rehab setting.  Physiatrist is providing close team supervision and 24 hour management  of active medical problems listed below.  Physiatrist and rehab team continue to assess barriers to discharge/monitor patient progress toward functional and medical goals  Care Tool:  Bathing    Body parts bathed by patient: Right arm, Left arm, Chest, Abdomen, Face   Body parts bathed by helper: Front perineal area, Buttocks, Right upper leg, Left upper leg, Right lower leg, Left lower leg     Bathing assist Assist Level: Maximal Assistance - Patient 24 - 49%     Upper Body Dressing/Undressing Upper body dressing   What is the patient wearing?: Pull over shirt    Upper body assist Assist Level: Minimal Assistance - Patient > 75%    Lower Body Dressing/Undressing Lower body dressing      What is the patient wearing?: Underwear/pull up, Pants     Lower body assist Assist for lower body dressing: Moderate Assistance - Patient 50 - 74%     Toileting Toileting    Toileting assist Assist for toileting: Maximal Assistance - Patient 25 - 49%     Transfers Chair/bed transfer  Transfers assist  Chair/bed transfer activity did not occur: Safety/medical concerns  Chair/bed transfer assist level: Minimal Assistance - Patient > 75%(slide board)     Locomotion Ambulation   Ambulation assist   Ambulation activity did not occur: Safety/medical concerns          Walk 10 feet activity   Assist  Walk 10 feet activity did not occur: Safety/medical concerns        Walk 50 feet activity   Assist Walk 50 feet with 2 turns activity did not  occur: Safety/medical concerns         Walk 150 feet activity   Assist Walk 150 feet activity did not occur: Safety/medical concerns         Walk 10 feet on uneven surface  activity   Assist Walk 10 feet on uneven surfaces activity did not occur: Safety/medical concerns         Wheelchair     Assist Will patient use wheelchair at discharge?: Yes Type of Wheelchair: Manual Wheelchair activity did not  occur: Safety/medical concerns  Wheelchair assist level: Supervision/Verbal cueing Max wheelchair distance: 75 ft    Wheelchair 50 feet with 2 turns activity    Assist    Wheelchair 50 feet with 2 turns activity did not occur: Safety/medical concerns   Assist Level: Supervision/Verbal cueing   Wheelchair 150 feet activity     Assist  Wheelchair 150 feet activity did not occur: Safety/medical concerns       Blood pressure 113/86, pulse (!) 126, temperature 98.1 F (36.7 C), temperature source Oral, resp. rate 18, height 5\' 7"  (1.702 m), weight 94.4 kg, SpO2 96 %.  Medical Problem List and Plan: 1. Impaired mobility and ADLs secondary to COVID-19  --Continue CIR therapies including PT, OT   2. Antithrombotics: -DVT/anticoagulation:Pharmaceutical:Lovenox -antiplatelet therapy: N/A 3.Chronic LBP/Pain Management:Currently 7/10. Oxycodone 5mg  prn. Apply heating pads 3 times per day PRN for low back pain. Continue lidocaine patch. Was on Oxycodone 15mg  PRN PTA.  4. Mood:LCSW to follow for evaluation and support. Team to provide ego support. -antipsychotic agents: N/A 5. Neuropsych: This patientiscapable of making decisions on herown behalf. 6. Skin/Wound Care:Routine pressure relief measures. 7. Fluids/Electrolytes/Nutrition:encourage PO  -mild hypokalemia-continue supplement--recheck monday  -continue protein supplements for low albumin 8. COPD: Has been on IV solumedrol --->prednisone taper 9. T2DM with neuropathy: Continue Lantus 30 units daily.   -was on metformin xr at home pta  -began amaryl 1mg  daily on 11/20---some early signs of improvement  -continue SSI for coverage  11/21-observe for consistent pattern 10. H/o depression: Monitor for now--not on any meds at this time. Seems very motivated 11. HTN: Monitor BP tid--currently on metoprolol 25mg  BID and controlled 12. OSA: CPAP at nights with 2L oxygen bled in.  13.  Insomnia: improved   -on trazodone 300mg  at home  - scheduled 50mg  trazodone at HS with good results 14. Anxiety: Xanax 0.125mg  BID PRN. Was on 1mg  BID PTA.  15. HLD: Continue atorvastatin 10mg  HS. 16. Leukocytosis d/t steroids  -steroid taper  -check cbc Monday 17. Nausea: resolved, may have been something she ate yesterday morning     LOS: 4 days A FACE TO FACE EVALUATION WAS PERFORMED  Meredith Staggers 07/26/2019, 10:24 AM

## 2019-07-26 NOTE — Progress Notes (Addendum)
Physical Therapy Session Note  Patient Details  Name: Vanessa Mack MRN: [id number] Date of Birth: August 10, 1951  Today's Date: 07/26/2019 PT Individual Time: [id number] and 1300-1355 PT Individual Time Calculation (min): 58 min and 55 min   Short Term Goals: Week 1:  PT Short Term Goal 1 (Week 1): Pt will perform bed mobility with min assist PT Short Term Goal 2 (Week 1): Pt will perform bed<>chair transfer with mod assist PT Short Term Goal 3 (Week 1): Pt will initiate gait training  Skilled Therapeutic Interventions/Progress Updates:  Treatment 1: Pt received in bed & agreeable to tx. Pt does not report any pain. Pt completes supine>sitting EOB with supervision, hospital bed features & significantly extra time. Pt completes slide board transfer bed>w/c with min assist with min cuing for head/hips relationship. In dayroom, pt requires +2 for sit<>stand with stedy with pt reporting lightheadedness & feeling weak. Vitals checked: HR = 155-161 bpm, SpO2 = 96% on room air, BP = 96/81 mmHg (RUE) sitting in stedy. Pt returned to sitting in w/c & given prolonged sitting break, then vitals re checked: BP = 113/86 mmHg (RUE), HR = 126 bpm with pt reporting symptoms are improving.  Pt then returned to standing with +2 assist and reports dizziness while standing in stedy so transitioned to sitting on stedy seat. Vitals: BP = 108/76 mmHg (RUE), HR = 151 bpm, SpO2 = 96%. After sitting pt reports symptoms are "calming down" but feeling like she "has a knot in her stomach". After prolonged sitting in stedy seat vitals: BP = 150 bpm, HR = 107/65 mmHg (RUE). Pt reports "something isn't right" & reports feeling like her head & chest are "fluttering & pounding, then it eases off, it comes & goes".  Pt returned to sitting in w/c & HR begins to decrease to 124 bpm with pt reporting her head & chest feel like they "are eased off". After seated rest break in w/c pt reports "I think I'm back to par". Vitals: BP = 118/85  mmHg (RUE), HR = 120 bpm, SpO2 = 96%. Pt propels w/c back to room with BUE & supervision with only 1 rest break with task focusing on cardiopulmonary endurance training. Set pt up at sink where pt performed grooming tasks (cleaning dentures, washing face) without physical assistance. At end of session pt reports 5/10 HA & pain meds requested from RN. Pt left in w/c with chair alarm donned & all needs at hand.  Treatment 2: Pt received in w/c & agreeable to tx. Assisted pt with combing & braiding hair for time management. Pt propels w/c room>dayroom with BUE & supervision. Pt completes slide board transfer w/c<>nu-step with min assist with min cuing for head/hips relationship. Pt utilized nu-step on level 3 x 10 minutes with BLE only with task focusing on BLE strengthening & endurance training; pt required 2 rest breaks during task 2/2 fatigue (HR after nu-step = 107 bpm). Pt propels w/c back to room in same manner, with 2 rest breaks 2/2 fatigue. Pt transfers w/c>bed with min assist slide board and sit>supine with supervision. Pt performed BLE bridging & glute sets with instructional cuing for technique & task focusing on glute/hip extensor strengthening. At end of session pt left in bed with alarm set & all needs at hand.  Pain: pt reports chronic pain in back, neck, & B knees - rest breaks provided PRN, RN made aware of pt's request for pain meds.  Therapy Documentation Precautions:  Precautions Precautions: Fall, Other (comment) Precaution  Comments: monitor HR, SpO2 Restrictions Weight Bearing Restrictions: No    Therapy/Group: Individual Therapy  Waunita Schooner 07/26/2019, 1:57 PM

## 2019-07-26 NOTE — Progress Notes (Signed)
Occupational Therapy Session Note  Patient Details  Name: Vanessa Mack MRN: [id number] Date of Birth: 12/15/1950  Today's Date: 07/26/2019 OT Individual Time: 1100-1200 OT Individual Time Calculation (min): 60 min    Short Term Goals: Week 1:  OT Short Term Goal 1 (Week 1): Pt will perform toilet transfer with mod A OT Short Term Goal 2 (Week 1): Pt will thread LB clothing with mod A sit to stand OT Short Term Goal 3 (Week 1): Pt will perform sit to stand with mod A consistently  Skilled Therapeutic Interventions/Progress Updates:    Pt received in wc ready for therapy. She worked on [employer] and dressing at sink level with min A to fully pull shirt overhead.  Used Stedy lift with mod - max A and +2 help for LB B/D.  Pt worked on sit to stand from w/c 5x to undress, bathe, dry, dress and to practice standing.  Once standing, pt could hold balance with bar and support from knee pads.  She tolerated standing for 60-90 seconds at a time with HR increasing to 120 but reducing to 90 with rest. Pt tolerated session well.  Resting in w/c to prepare for lunch with belt alarm on and all needs met.    Therapy Documentation Precautions:  Precautions Precautions: Fall, Other (comment) Precaution Comments: monitor HR, SpO2 Restrictions Weight Bearing Restrictions: No    Vital Signs: Therapy Vitals Pulse Rate: 81 Resp: 18 BP: 134/76 Patient Position (if appropriate): Lying Oxygen Therapy SpO2: 97 % O2 Device: Room Air Pain: Pain Assessment Pain Scale: 0-10 Pain Score: 2  Pain Location: Generalized Patients Stated Pain Goal: 3 Pain Intervention(s): Medication (See eMAR) ADL: ADL Equipment Provided: Leg straps Grooming: Minimal assistance Upper Body Bathing: Minimal assistance Lower Body Bathing: Maximal assistance Where Assessed-Lower Body Bathing: Edge of bed Upper Body Dressing: Minimal assistance Where Assessed-Upper Body Dressing: Edge of bed Lower Body Dressing:  Dependent Where Assessed-Lower Body Dressing: Edge of bed Toileting: Dependent Where Assessed-Toileting: Glass blower/designer: [employer]imal [employer] production planner Method: Nutritional therapist: Not assessed   Therapy/Group: Individual Therapy  [place] 07/26/2019, 7:59 AM

## 2019-07-27 ENCOUNTER — Inpatient Hospital Stay (HOSPITAL_COMMUNITY): Payer: Medicare Other | Admitting: Occupational Therapy

## 2019-07-27 ENCOUNTER — Inpatient Hospital Stay (HOSPITAL_COMMUNITY): Payer: Medicare Other

## 2019-07-27 DIAGNOSIS — R5381 Other malaise: Secondary | ICD-10-CM | POA: Diagnosis not present

## 2019-07-27 LAB — GLUCOSE, CAPILLARY
Glucose-Capillary: 112 mg/dL — ABNORMAL HIGH (ref 70–99)
Glucose-Capillary: 123 mg/dL — ABNORMAL HIGH (ref 70–99)
Glucose-Capillary: 130 mg/dL — ABNORMAL HIGH (ref 70–99)
Glucose-Capillary: 131 mg/dL — ABNORMAL HIGH (ref 70–99)

## 2019-07-27 NOTE — Plan of Care (Signed)
  Problem: RH BOWEL ELIMINATION Goal: RH STG MANAGE BOWEL WITH ASSISTANCE Description: STG Manage Bowel with Min Assistance. Outcome: Progressing   Problem: RH BLADDER ELIMINATION Goal: RH STG MANAGE BLADDER WITH ASSISTANCE Description: STG Manage Bladder With Min Assistance Outcome: Progressing   Problem: RH SKIN INTEGRITY Goal: RH STG SKIN FREE OF INFECTION/BREAKDOWN Description: No new breakdown with min assist  Outcome: Progressing Goal: RH STG ABLE TO PERFORM INCISION/WOUND CARE W/ASSISTANCE Description: STG Able To Perform Incision/Wound Care With Mod Assistance. Outcome: Progressing   Problem: RH SAFETY Goal: RH STG ADHERE TO SAFETY PRECAUTIONS W/ASSISTANCE/DEVICE Description: STG Adhere to Safety Precautions With min Assistance/Device. Outcome: Progressing   Problem: RH PAIN MANAGEMENT Goal: RH STG PAIN MANAGED AT OR BELOW PT'S PAIN GOAL Description: <3 out of 10.  Outcome: Progressing

## 2019-07-27 NOTE — Progress Notes (Signed)
Physical Therapy Session Note  Patient Details  Name: Vanessa Mack MRN: [id number] Date of Birth: 1951-06-23  Today's Date: 07/27/2019 PT Individual Time: 1300-1400 PT Individual Time Calculation (min): 60 min   Short Term Goals: Week 1:  PT Short Term Goal 1 (Week 1): Pt will perform bed mobility with min assist PT Short Term Goal 2 (Week 1): Pt will perform bed<>chair transfer with mod assist PT Short Term Goal 3 (Week 1): Pt will initiate gait training  Skilled Therapeutic Interventions/Progress Updates:     Patient in w/c in the room upon PT arrival. Patient alert and agreeable to PT session. Patient reported 6/10 B LE and back pain during session, RN made aware. PT provided repositioning, rest breaks, and distraction as pain interventions throughout session. Patient also reported that she walked for the first time with PT earlier and her LEs were very fatigued this afternoon. Patient requested to use the bathroom at beginning of session.   Therapeutic Activity: Bed Mobility: Patient performed supine to sit with supervision and increased time to complete task in a flat bed without use of bed rails. Transfers: Patient performed a slide board transfer to/from a drop arm BSC with min A-CGA and total A for board placement. Provided cues for board placement, head-hips relationship, and hand placement. She required mod-max A for doffing/donning pants seated on the [facility] and supervision for peri-care during toileting. She attempted sit to/from stand x2 from the [facility] with max A, but patient was unable to come to standing due to increased LE fatigue. Provided verbal cues for foot placement and forward weight shift.  Wheelchair Mobility:  Patient propelled wheelchair 145 feet using B UEs and LEs with min A for initiating then supervision. Provided verbal cues for scooting forward in the chair and leaning forward to increased LE advantage for propulsion and turning technique.   Therapeutic  Exercise: Patient performed the following exercises with verbal and tactile cues for proper technique for LE and abdominal strengthing. -Kinetron seated at 50 cycles/sec resistance 2x75min with a 2 min rest break in between trials -seated marching x10 -heel slides x10 -R SLR x3, patient reported increased back pain, terminated exercise due to pain -abdominal drawing in maneuver (ADIM) in hook-lying x10 with tactile cues for activation -B knee to chest with ADIM x5 with 10 sec hold with reduced back pain after with 25-50% manual assist  Patient in bed at end of session with breaks locked, bed alarm set, and all needs within reach. Patient remained on RA throughout session, SPO2 >96% throughout. HR elevated to 125-135 with activity, recovered to 80-90 <1 min with seated rest breaks, RN made aware.  Therapy Documentation Precautions:  Precautions Precautions: Fall, Other (comment) Precaution Comments: monitor HR, SpO2 Restrictions Weight Bearing Restrictions: No    Therapy/Group: Individual Therapy  Jaylyn Iyer L Gibbs Naugle PT, DPT  07/27/2019, 4:21 PM

## 2019-07-27 NOTE — Progress Notes (Signed)
Physical Therapy Session Note  Patient Details  Name: Vanessa Mack MRN: [id number] Date of Birth: 1951/06/12  Today's Date: 07/27/2019 PT Individual Time: 0901-1000 PT Individual Time Calculation (min): 59 min   Short Term Goals: Week 1:  PT Short Term Goal 1 (Week 1): Pt will perform bed mobility with min assist PT Short Term Goal 2 (Week 1): Pt will perform bed<>chair transfer with mod assist PT Short Term Goal 3 (Week 1): Pt will initiate gait training  Skilled Therapeutic Interventions/Progress Updates:    Session focused on overall activity tolerance/endurance, bed mobility, lower body dressing, functional transfers, and use of Maxi Sky for initiation of gait training. Pt able to perform supine to sit to EOB with supervision. Donned pants EOB with supervision for threading and then utilized stedy for sit <> stand from elevated surface with min assist. Pt able to maintain standing while pulling up pants and shifting weight/marching in place once pants in place x ~ 1 min with no buckling or LOB required CGA overall. Min assist for sit -> stand from perched surface and transferred into w/c via Stedy. Pt set up at sink and performed face washing and hairbrushing. She states this is the first time she has been able to use her L arm functionally to brush her hair independently! Transferred via slideboard from w/c to mat table in therapy gym with overall min assist and cues for head hips relationship during transfer as well as using her leg to push through more than her arms. Using Maxi sky sling for safety engaged in sit <> stand with RW from elevated mat table (mod assist) and then able to gait x 15' with minimal support of maxi sky. No episodes of knees buckling. Pt sat into regular chair to rest - vitals = HR = 122 bpm and O2 = 92% (decreased to HR = 108 bpm and O2 = 95% with rest break). Pt unable to perform sit <> stand from chair with max assist and requires use of Lift to support her when  attempting second gait trial. Due to fatigue pt states she could not and returned to w/c. Pt very pleased with overall progress this session.    Therapy Documentation Precautions:  Precautions Precautions: Fall, Other (comment) Precaution Comments: monitor HR, SpO2 Restrictions Weight Bearing Restrictions: No Pain: Reports chronic arthritis pain - premedicated.     Therapy/Group: Individual Therapy  Canary Brim Ivory Broad, PT, DPT, CBIS  07/27/2019, 12:15 PM

## 2019-07-27 NOTE — Progress Notes (Signed)
Occupational Therapy Session Note  Patient Details  Name: Vanessa Mack MRN: [id number] Date of Birth: [birth year]  Today's Date: 07/27/2019 OT Individual Time: 1105-1200 OT Individual Time Calculation (min): 55 min   Short Term Goals: Week 1:  OT Short Term Goal 1 (Week 1): Pt will perform toilet transfer with mod A OT Short Term Goal 2 (Week 1): Pt will thread LB clothing with mod A sit to stand OT Short Term Goal 3 (Week 1): Pt will perform sit to stand with mod A consistently  Skilled Therapeutic Interventions/Progress Updates:    Pt greeted in w/c and premedicated for pain. Wanting to practice slideboard BSC transfers for nighttime toileting at CIR. She presently uses the bedpan with staff. After OT placed board, pt able to laterally scoot to bed with steady assist. The same assist required for drop arm BSC transfer. Pt unable to complete squat or partial stand for clothing mgt, and therefore needed to lean Rt>Lt to do so. Able to doff pants and brief using lateral leans. Pt was set up to complete perihygiene in the front with her Rt foot propped up on bed. Even when OT assisted with spreading Lt leg, pt was unable to reach and therefore needed A for hygiene. Max A for perihygiene in the back as well. Once new brief was donned, attempted slideboard transfer<bed using chuck pad. Starting off the transfer was too difficult this way, and therefore we needed to don her pants again before scooting back over to bed. Discussed openly with pt about making safety plan changes in regards to her toileting care. Pt expressed wanting to practice [facility] transfers and adaptive toileting skills more so she can do more for herself at this time, requesting to continue using bedpan with staff for safety. OT provided her with a female urinal and we discussed the possibility of upright toileting EOB. She'd benefit from practicing this at a later date, states she's up multiple times at night with urgency to void  bladder. Pt transferred to the w/c at end of session, left her with all needs within reach and safety belt fastened.   Therapy Documentation Precautions:  Precautions Precautions: Fall, Other (comment) Precaution Comments: monitor HR, SpO2 Restrictions Weight Bearing Restrictions: No Pain: Back pain. Set pt up with k-pad at end of session to address Pain Assessment Pain Scale: 0-10 Pain Score: 7  Pain Type: Acute pain Pain Intervention(s): Medication (See eMAR) ADL: ADL Equipment Provided: Leg straps Grooming: Minimal assistance Upper Body Bathing: Minimal assistance Lower Body Bathing: Maximal assistance Where Assessed-Lower Body Bathing: Edge of bed Upper Body Dressing: Minimal assistance Where Assessed-Upper Body Dressing: Edge of bed Lower Body Dressing: Dependent Where Assessed-Lower Body Dressing: Edge of bed Toileting: Dependent Where Assessed-Toileting: Glass blower/designer: [employer] production planner Method: Nutritional therapist: Not assessed      Therapy/Group: Individual Therapy  Carilyn Woolston A Kayliee Atienza 07/27/2019, 12:31 PM

## 2019-07-27 NOTE — Progress Notes (Signed)
[place] PHYSICAL MEDICINE & REHABILITATION PROGRESS NOTE   Subjective/Complaints: No complaints this morning. SOB improved. Considers it a miracle that she no longer requires oxygen while she was on 3L at home. Denies constipation. Nurse requested that I look at "spot on anus." Patient says she has felt this since she was in acute care, feels some pain while lying on her back, no pain while having a BM.   ROS: Patient denies fever, rash, sore throat, blurred vision, nausea, vomiting, diarrhea, cough, shortness of breath or chest pain, joint or back pain, headache, or mood change.   Objective:   No results found. No results for input(s): WBC, HGB, HCT, PLT in the last 72 hours. No results for input(s): NA, K, CL, CO2, GLUCOSE, BUN, CREATININE, CALCIUM in the last 72 hours.  Intake/Output Summary (Last 24 hours) at 07/27/2019 1028 Last data filed at 07/27/2019 [id number] Gross per 24 hour  Intake 620 ml  Output -  Net 620 ml     Physical Exam: Vital Signs Blood pressure (!) 159/95, pulse 79, temperature 97.8 F (36.6 C), temperature source Oral, resp. rate 15, height 5\' 7"  (1.702 m), weight 94.4 kg, SpO2 94 %.  Constitutional: No distress . Vital signs reviewed. HEENT: EOMI, oral membranes moist Neck: supple Cardiovascular: RRR without murmur. No JVD    Respiratory: CTA Bilaterally without wheezes or rales. Normal effort    GI: BS +, non-tender, non-distended  Extremities: No clubbing, cyanosis, or edema. Pulses are 2+ Skin: Clean and intact without signs of breakdown, feet dry. Has small red pinpoint lesion on anus. No bleeding.  Neuro: Pt is cognitively appropriate with normal insight, memory, and awareness. Cranial nerves 2-12 are intact. Sensory exam is normal. Reflexes are 1+ in all 4's. Fine motor coordination is intact. No tremors. Motor function is grossly 4/5 UE, 3/5 HF, 3+/5 KE and 4/5 ADF/PF.--no changes on neuro exam  Musculoskeletal: functional ROM. No obvious areas of  pain Psych: pleasant      Assessment/Plan: 1. Functional deficits secondary to debility after COVID which require 3+ hours per day of interdisciplinary therapy in a comprehensive inpatient rehab setting.  Physiatrist is providing close team supervision and 24 hour management of active medical problems listed below.  Physiatrist and rehab team continue to assess barriers to discharge/monitor patient progress toward functional and medical goals  Care Tool:  Bathing    Body parts bathed by patient: Right arm, Left arm, Chest, Abdomen, Face   Body parts bathed by helper: Front perineal area, Buttocks, Right upper leg, Left upper leg, Right lower leg, Left lower leg     Bathing assist Assist Level: Maximal Assistance - Patient 24 - 49%     Upper Body Dressing/Undressing Upper body dressing   What is the patient wearing?: Pull over shirt    Upper body assist Assist Level: Minimal Assistance - Patient > 75%    Lower Body Dressing/Undressing Lower body dressing      What is the patient wearing?: Underwear/pull up, Pants     Lower body assist Assist for lower body dressing: Total Assistance - Patient < 25%     Toileting Toileting    Toileting assist Assist for toileting: Maximal Assistance - Patient 25 - 49%     Transfers Chair/bed transfer  Transfers assist  Chair/bed transfer activity did not occur: Safety/medical concerns  Chair/bed transfer assist level: Minimal Assistance - Patient > 75%     Locomotion Ambulation   Ambulation assist   Ambulation activity did not  occur: Safety/medical concerns          Walk 10 feet activity   Assist  Walk 10 feet activity did not occur: Safety/medical concerns        Walk 50 feet activity   Assist Walk 50 feet with 2 turns activity did not occur: Safety/medical concerns         Walk 150 feet activity   Assist Walk 150 feet activity did not occur: Safety/medical concerns         Walk 10 feet on  uneven surface  activity   Assist Walk 10 feet on uneven surfaces activity did not occur: Safety/medical concerns         Wheelchair     Assist Will patient use wheelchair at discharge?: Yes Type of Wheelchair: Manual Wheelchair activity did not occur: Safety/medical concerns  Wheelchair assist level: Supervision/Verbal cueing Max wheelchair distance: 75 ft    Wheelchair 50 feet with 2 turns activity    Assist    Wheelchair 50 feet with 2 turns activity did not occur: Safety/medical concerns   Assist Level: Supervision/Verbal cueing   Wheelchair 150 feet activity     Assist  Wheelchair 150 feet activity did not occur: Safety/medical concerns       Blood pressure (!) 159/95, pulse 79, temperature 97.8 F (36.6 C), temperature source Oral, resp. rate 15, height 5\' 7"  (1.702 m), weight 94.4 kg, SpO2 94 %.  Medical Problem List and Plan: 1. Impaired mobility and ADLs secondary to COVID-19  --Continue CIR therapies including PT, OT   2. Antithrombotics: -DVT/anticoagulation:Pharmaceutical:Lovenox -antiplatelet therapy: N/A 3.Chronic LBP/Pain Management:Currently 7/10. Oxycodone 5mg  prn. Apply heating pads 3 times per day PRN for low back pain. Continue lidocaine patch. Was on Oxycodone 15mg  PRN PTA.  4. Mood:LCSW to follow for evaluation and support. Team to provide ego support. -antipsychotic agents: N/A 5. Neuropsych: This patientiscapable of making decisions on herown behalf. 6. Skin/Wound Care:Routine pressure relief measures. 7. Fluids/Electrolytes/Nutrition:encourage PO  -mild hypokalemia-continue supplement--recheck monday  -continue protein supplements for low albumin 8. COPD: Has been on IV solumedrol --->prednisone taper 9. T2DM with neuropathy: Continue Lantus 30 units daily.   -was on metformin xr at home pta  -began amaryl 1mg  daily on 11/20---some early signs of improvement  -continue SSI for  coverage  11/21-observe for consistent pattern 10. H/o depression: Monitor for now--not on any meds at this time. Seems very motivated 11. HTN: Monitor BP tid--currently on metoprolol 25mg  BID and controlled 12. OSA: CPAP at nights with 2L oxygen bled in.  13. Insomnia: improved   -on trazodone 300mg  at home  - scheduled 50mg  trazodone at HS with good results 14. Anxiety: Xanax 0.125mg  BID PRN. Was on 1mg  BID PTA.  15. HLD: Continue atorvastatin 10mg  HS. 16. Leukocytosis d/t steroids  -steroid taper  -check cbc Monday 17. Nausea: resolved, may have been something she ate yesterday morning  18. Pinpoint lesion on anus: Does not appear to be pressure injury. Recommend frequent repositioning q2H to prevent worsening and for patient's comfort. Discussed with patient and she will also make effort to turn on her side more during the day.     LOS: 5 days A FACE TO FACE EVALUATION WAS PERFORMED  Martha Clan P Siddiq Kaluzny 07/27/2019, 10:28 AM

## 2019-07-28 ENCOUNTER — Inpatient Hospital Stay (HOSPITAL_COMMUNITY): Payer: Medicare Other | Admitting: Physical Therapy

## 2019-07-28 ENCOUNTER — Inpatient Hospital Stay (HOSPITAL_COMMUNITY): Payer: Medicare Other | Admitting: Occupational Therapy

## 2019-07-28 ENCOUNTER — Inpatient Hospital Stay (HOSPITAL_COMMUNITY): Payer: Medicare Other

## 2019-07-28 DIAGNOSIS — R5381 Other malaise: Secondary | ICD-10-CM | POA: Diagnosis not present

## 2019-07-28 LAB — CBC
HCT: 40.5 % (ref 36.0–46.0)
Hemoglobin: 13 g/dL (ref 12.0–15.0)
MCH: 29.2 pg (ref 26.0–34.0)
MCHC: 32.1 g/dL (ref 30.0–36.0)
MCV: 91 fL (ref 80.0–100.0)
Platelets: 151 10*3/uL (ref 150–400)
RBC: 4.45 MIL/uL (ref 3.87–5.11)
RDW: 15.3 % (ref 11.5–15.5)
WBC: 11.3 10*3/uL — ABNORMAL HIGH (ref 4.0–10.5)
nRBC: 0 % (ref 0.0–0.2)

## 2019-07-28 LAB — BASIC METABOLIC PANEL
Anion gap: 11 (ref 5–15)
BUN: 15 mg/dL (ref 8–23)
CO2: 26 mmol/L (ref 22–32)
Calcium: 9.2 mg/dL (ref 8.9–10.3)
Chloride: 104 mmol/L (ref 98–111)
Creatinine, Ser: 0.49 mg/dL (ref 0.44–1.00)
GFR calc Af Amer: >60 mL/min (ref >60)
GFR calc non Af Amer: >60 mL/min (ref >60)
Glucose, Bld: 109 mg/dL — ABNORMAL HIGH (ref 70–99)
Potassium: 4 mmol/L (ref 3.5–5.1)
Sodium: 141 mmol/L (ref 135–145)

## 2019-07-28 LAB — GLUCOSE, CAPILLARY
Glucose-Capillary: 130 mg/dL — ABNORMAL HIGH (ref 70–99)
Glucose-Capillary: 217 mg/dL — ABNORMAL HIGH (ref 70–99)
Glucose-Capillary: 90 mg/dL (ref 70–99)
Glucose-Capillary: 100 mg/dL — ABNORMAL HIGH (ref 70–99)

## 2019-07-28 NOTE — Progress Notes (Signed)
Occupational Therapy Session Note  Patient Details  Name: Vanessa Mack MRN: [id number] Date of Birth: [birth year]  Today's Date: 07/28/2019  Session 1 OT Individual Time: 0845-1000 OT Individual Time Calculation (min): 75 min   Session 2 OT Individual Time: 1430-1500 OT Individual Time Calculation (min): 30 min   Short Term Goals: Week 1:  OT Short Term Goal 1 (Week 1): Pt will perform toilet transfer with mod A OT Short Term Goal 2 (Week 1): Pt will thread LB clothing with mod A sit to stand OT Short Term Goal 3 (Week 1): Pt will perform sit to stand with mod A consistently  Skilled Therapeutic Interventions/Progress Updates:  Session 1   Pt greeted semi-reclined in bed and agreeable to OT treatment session. Pt agreeable to shower today. Stedy used for transfer into shower onto tub bench with mod A sit<>stand. Bathing completed with pt needing OT assist to wash LEs and assistance to bring L UE to head to help was her hair. Sit<>stand after shower in stedy with max A and use of towel around waist. Pt maintained sitting in Stedy to brush her hair with min A and don deodorant and shirt w/ min A 2.2 L shoulder weakness. Pt stood for 30 seconds in stedy again for OT to apply barrier cream and brief. Pt returned to sitting, then stated she felt dizzy and nauseous and faint. OT tilted pt back in wc with improved symptoms. OT eased pt back to sitting and was ok the rest of session. Pt was able to thread R LE into pants with assist for L. Sit<>stand at the sink only with max A of 1 and no knee buckling! OT assist to pull up pants. Worked on B UE strengthening with hair drying task. Pt able to dry hair for 1 minute intervals. OT then braided pt hair and she was left sitting in wc with call bell in reach and needs met.   Session 2 Pt greeted sitting in wc and agreeable to OT treatment session. Pt reported need to go to the bathroom. Pt completed stand-pivot to [facility] raised over toilet and use of grab  bars with mod A. Pt needed OT assist to doff pants, but was able to void. Pt stood from raised commode with mod A, then was able to reach to perform peri-care. Pt then sat for rest break. Pt stood again and OT assisted with clothing management, before pivoting back to wc. Pt brought to therapy gym and completed 3 sets of 10 bicep curls, chest press, and straight arm raises using 2 lb dowel rod. Pt returned to room and left seated in wc with call bell in reach and needs met.   Therapy Documentation Precautions:  Precautions Precautions: Fall, Other (comment) Precaution Comments: monitor HR, SpO2 Restrictions Weight Bearing Restrictions: No Pain: Denies pain  Therapy/Group: Individual Therapy  Valma Cava 07/28/2019, 3:03 PM

## 2019-07-28 NOTE — Plan of Care (Signed)
  Problem: RH BOWEL ELIMINATION Goal: RH STG MANAGE BOWEL WITH ASSISTANCE Description: STG Manage Bowel with Min Assistance. Outcome: Progressing   Problem: RH BLADDER ELIMINATION Goal: RH STG MANAGE BLADDER WITH ASSISTANCE Description: STG Manage Bladder With Min Assistance Outcome: Progressing   Problem: RH SKIN INTEGRITY Goal: RH STG SKIN FREE OF INFECTION/BREAKDOWN Description: No new breakdown with min assist  Outcome: Progressing Goal: RH STG ABLE TO PERFORM INCISION/WOUND CARE W/ASSISTANCE Description: STG Able To Perform Incision/Wound Care With Mod Assistance. Outcome: Progressing   Problem: RH SAFETY Goal: RH STG ADHERE TO SAFETY PRECAUTIONS W/ASSISTANCE/DEVICE Description: STG Adhere to Safety Precautions With min Assistance/Device. Outcome: Progressing   Problem: RH PAIN MANAGEMENT Goal: RH STG PAIN MANAGED AT OR BELOW PT'S PAIN GOAL Description: <3 out of 10.  Outcome: Progressing

## 2019-07-28 NOTE — Progress Notes (Addendum)
Physical Therapy Session Note  Patient Details  Name: Vanessa Mack MRN: [id number] Date of Birth: 07/25/1951  Today's Date: 07/28/2019 PT Individual Time: [id number] PT Individual Time Calculation (min): 41 min   Short Term Goals: Week 1:  PT Short Term Goal 1 (Week 1): Pt will perform bed mobility with min assist PT Short Term Goal 2 (Week 1): Pt will perform bed<>chair transfer with mod assist PT Short Term Goal 3 (Week 1): Pt will initiate gait training   Skilled Therapeutic Interventions/Progress Updates:  Pt received in w/c & agreeable to tx. Transported pt to/from gym via w/c dependent assist for time management. Pt transfers sit<>stand in parallel bars with max assist with pt progressing from from pulling up on bars to pushing with 1UE on 1 armrest in preparation for use of RW. Pt engaged in static standing with pt able to lift 1UE off of the bar at a time with only min assist for balance. Pt then progressed to take 3-6 steps forward/backwards in parallel bars with therapist blocking B knees with only ~2 instances of buckling with pt requiring min assist overall for balance. Pt then transfers sit>stand with +2 to RW and pt ambulates ~5 ft with RW & min assist + w/c follow for safety with min assist overall with pt demonstrating short step length BLE, short stride length, and decreased gait speed with pt requiring seated rest break 2/2 BLE fatigue. At end of session pt left in w/c with chair alarm donned & all needs in reach.  No c/o pain reported during session.  Therapy Documentation Precautions:  Precautions Precautions: Fall, Other (comment) Precaution Comments: monitor HR, SpO2 Restrictions Weight Bearing Restrictions: No    Therapy/Group: Individual Therapy  Waunita Schooner 07/28/2019, 1:49 PM

## 2019-07-28 NOTE — Progress Notes (Signed)
[place] PHYSICAL MEDICINE & REHABILITATION PROGRESS NOTE   Subjective/Complaints: Vanessa Mack is in a pleasant mood and listening to inspirational music. No complaints this morning. SOB improved. Considers it a miracle that she no longer requires oxygen while she was on 3L at home. Denies constipation.   ROS: Patient denies fever, rash, sore throat, blurred vision, nausea, vomiting, diarrhea, cough, shortness of breath or chest pain, joint or back pain, headache, or mood change.   Objective:   No results found. Recent Labs    07/28/19 0652  WBC 11.3*  HGB 13.0  HCT 40.5  PLT 151   Recent Labs    07/28/19 0652  NA 141  K 4.0  CL 104  CO2 26  GLUCOSE 109*  BUN 15  CREATININE 0.49  CALCIUM 9.2    Intake/Output Summary (Last 24 hours) at 07/28/2019 0845 Last data filed at 07/28/2019 0809 Gross per 24 hour  Intake 840 ml  Output -  Net 840 ml     Physical Exam: Vital Signs Blood pressure (!) 141/76, pulse 78, temperature 98.4 F (36.9 C), temperature source Oral, resp. rate 15, height 5\' 7"  (1.702 m), weight 94.4 kg, SpO2 96 %.  Constitutional: No distress . Vital signs reviewed. HEENT: EOMI, oral membranes moist Neck: supple Cardiovascular: RRR without murmur. No JVD    Respiratory: CTA Bilaterally without wheezes or rales. Normal effort    GI: BS +, non-tender, non-distended  Extremities: No clubbing, cyanosis, or edema. Pulses are 2+ Skin: Clean and intact without signs of breakdown, feet dry. Has small red pinpoint lesion on anus. No bleeding.  Neuro: Pt is cognitively appropriate with normal insight, memory, and awareness. Cranial nerves 2-12 are intact. Sensory exam is normal. Reflexes are 1+ in all 4's. Fine motor coordination is intact. No tremors. Motor function is grossly 4/5 UE, 3/5 HF, 3+/5 KE and 4/5 ADF/PF.--no changes on neuro exam  Musculoskeletal: functional ROM. No obvious areas of pain Psych: pleasant      Assessment/Plan: 1. Functional  deficits secondary to debility after COVID which require 3+ hours per day of interdisciplinary therapy in a comprehensive inpatient rehab setting.  Physiatrist is providing close team supervision and 24 hour management of active medical problems listed below.  Physiatrist and rehab team continue to assess barriers to discharge/monitor patient progress toward functional and medical goals  Care Tool:  Bathing    Body parts bathed by patient: Right arm, Left arm, Chest, Abdomen, Face   Body parts bathed by helper: Front perineal area, Buttocks, Right upper leg, Left upper leg, Right lower leg, Left lower leg     Bathing assist Assist Level: Maximal Assistance - Patient 24 - 49%     Upper Body Dressing/Undressing Upper body dressing   What is the patient wearing?: Pull over shirt    Upper body assist Assist Level: Minimal Assistance - Patient > 75%    Lower Body Dressing/Undressing Lower body dressing      What is the patient wearing?: Pants     Lower body assist Assist for lower body dressing: Minimal Assistance - Patient > 75%(standing with Charlaine Dalton)     Toileting Toileting    Toileting assist Assist for toileting: Maximal Assistance - Patient 25 - 49%     Transfers Chair/bed transfer  Transfers assist  Chair/bed transfer activity did not occur: Safety/medical concerns  Chair/bed transfer assist level: Minimal Assistance - Patient > 75%(slideboard w/ c-> mat)     Locomotion Ambulation   Ambulation assist  Ambulation activity did not occur: Safety/medical concerns  Assist level: Total Assistance - Patient < 25% Assistive device: Maxi Sky(+RW) Max distance: 15'   Walk 10 feet activity   Assist  Walk 10 feet activity did not occur: Safety/medical concerns  Assist level: Total Assistance - Patient < 25% Assistive device: Walker-rolling, Maxi Sky   Walk 50 feet activity   Assist Walk 50 feet with 2 turns activity did not occur: Safety/medical  concerns         Walk 150 feet activity   Assist Walk 150 feet activity did not occur: Safety/medical concerns         Walk 10 feet on uneven surface  activity   Assist Walk 10 feet on uneven surfaces activity did not occur: Safety/medical concerns         Wheelchair     Assist Will patient use wheelchair at discharge?: Yes Type of Wheelchair: Manual Wheelchair activity did not occur: Safety/medical concerns  Wheelchair assist level: Supervision/Verbal cueing Max wheelchair distance: 75 ft    Wheelchair 50 feet with 2 turns activity    Assist    Wheelchair 50 feet with 2 turns activity did not occur: Safety/medical concerns   Assist Level: Supervision/Verbal cueing   Wheelchair 150 feet activity     Assist  Wheelchair 150 feet activity did not occur: Safety/medical concerns       Blood pressure (!) 141/76, pulse 78, temperature 98.4 F (36.9 C), temperature source Oral, resp. rate 15, height 5\' 7"  (1.702 m), weight 94.4 kg, SpO2 96 %.  Medical Problem List and Plan: 1. Impaired mobility and ADLs secondary to COVID-19  --Continue CIR therapies including PT, OT    --Ordered IS to prevent atelectasis. Educated patient regarding its use.  2. Antithrombotics: -DVT/anticoagulation:Pharmaceutical:Lovenox -antiplatelet therapy: N/A 3.Chronic LBP/Pain Management:Currently 7/10. Oxycodone 5mg  prn. Apply heating pads 3 times per day PRN for low back pain. Continue lidocaine patch. Was on Oxycodone 15mg  PRN PTA.  4. Mood:LCSW to follow for evaluation and support. Team to provide ego support. -antipsychotic agents: N/A 5. Neuropsych: This patientiscapable of making decisions on herown behalf. 6. Skin/Wound Care:Routine pressure relief measures. 7. Fluids/Electrolytes/Nutrition:encourage PO  -mild hypokalemia-continue supplement.  -11/23: hypokalemia resolved. 4 on today's BMP. Will discontinue potassium supplement.    -continue protein supplements for low albumin 8. COPD: Has been on IV solumedrol --->prednisone taper 9. T2DM with neuropathy: Continue Lantus 30 units daily.   -was on metformin xr at home pta  -began amaryl 1mg  daily on 11/20---some early signs of improvement  -continue SSI for coverage  11/21-observe for consistent pattern 10. H/o depression: Monitor for now--not on any meds at this time. Seems very motivated 11. HTN: Monitor BP tid--currently on metoprolol 25mg  BID and controlled 12. OSA: CPAP at nights with 2L oxygen bled in.  13. Insomnia: improved   -on trazodone 300mg  at home  - scheduled 50mg  trazodone at HS with good results 14. Anxiety: Xanax 0.125mg  BID PRN. Was on 1mg  BID PTA.  15. HLD: Continue atorvastatin 10mg  HS. 16. Leukocytosis d/t steroids  -steroid taper  -CBC today with 11.3 WBC, down from 14.2 last check. 17. Nausea: resolved, may have been something she ate yesterday morning  18. Pinpoint lesion on anus: Does not appear to be pressure injury. Recommend frequent repositioning q2H to prevent worsening and for patient's comfort. Discussed with patient and she will also make effort to turn on her side more during the day.     LOS: 6 days A FACE TO  FACE EVALUATION WAS PERFORMED  Vanessa Mack 07/28/2019, 8:45 AM

## 2019-07-28 NOTE — Progress Notes (Signed)
Physical Therapy Session Note  Patient Details  Name: Vanessa Mack MRN: [id number] Date of Birth: 1951/08/29  Today's Date: 07/28/2019 PT Individual Time: 1115-1200 PT Individual Time Calculation (min): 45 min   Short Term Goals: Week 1:  PT Short Term Goal 1 (Week 1): Pt will perform bed mobility with min assist PT Short Term Goal 2 (Week 1): Pt will perform bed<>chair transfer with mod assist PT Short Term Goal 3 (Week 1): Pt will initiate gait training      Skilled Therapeutic Interventions/Progress Updates:  Pt sitting in w/c; she c/o pain 4/10 generalized joint pain; premedicated. .   Pt reported that she had a recliner at home with stand-assist feature, which she used due to knee and back pain.  She reported that her sofa is quite low, so she avoids sitting on it.  PT and pt problem-solved for raising seat of sofa, so that she would strengthen her core during sit>< stands instead of recliner lifting her up.  She verbalized understanding.   Seated strengthening: 5 x 3reciprocal scooting forward/backward and 10 x 1 trunk extension/flexion without use of UEs to facilitate core activation, 10 x 2  bil scapular retraction, bil hip adductor squeezes, bil glut sets (with feet unsupported, on floor no footrests, to prevent activation of quads). Active assistive L shoulder flexion 2 x 10.   PT educated pt about biomechanics for shoulder for joint protection  O2 sats 98% and HR 118 with activity.  Transfer training for forward wt shift, trunk and cervical flexion, and foot placement.  Pt unable to elevate hips at all, with max assist of 1, but did wt shift forward well.  At end of session, pt in w/c with needs at hand.     Therapy Documentation Precautions:  Precautions Precautions: Fall, Other (comment) Precaution Comments: monitor HR, SpO2 Restrictions Weight Bearing Restrictions: No          Therapy/Group: Individual Therapy  Anneli Bing 07/28/2019, 12:14 PM

## 2019-07-29 ENCOUNTER — Inpatient Hospital Stay (HOSPITAL_COMMUNITY): Payer: Medicare Other | Admitting: Physical Therapy

## 2019-07-29 ENCOUNTER — Inpatient Hospital Stay (HOSPITAL_COMMUNITY): Payer: Medicare Other | Admitting: Occupational Therapy

## 2019-07-29 DIAGNOSIS — E876 Hypokalemia: Secondary | ICD-10-CM | POA: Diagnosis not present

## 2019-07-29 DIAGNOSIS — G479 Sleep disorder, unspecified: Secondary | ICD-10-CM | POA: Diagnosis not present

## 2019-07-29 DIAGNOSIS — E119 Type 2 diabetes mellitus without complications: Secondary | ICD-10-CM | POA: Diagnosis not present

## 2019-07-29 DIAGNOSIS — R5381 Other malaise: Secondary | ICD-10-CM | POA: Diagnosis not present

## 2019-07-29 LAB — GLUCOSE, CAPILLARY
Glucose-Capillary: 122 mg/dL — ABNORMAL HIGH (ref 70–99)
Glucose-Capillary: 98 mg/dL (ref 70–99)
Glucose-Capillary: 76 mg/dL (ref 70–99)
Glucose-Capillary: 136 mg/dL — ABNORMAL HIGH (ref 70–99)

## 2019-07-29 NOTE — Progress Notes (Signed)
Physical Therapy Session Note  Patient Details  Name: Vanessa Mack MRN: [id number] Date of Birth: 16-Mar-1951  Today's Date: 07/29/2019 PT Individual Time: 1455-1520 PT Individual Time Calculation (min): 25 min   Short Term Goals: Week 2:  PT Short Term Goal 1 (Week 2): Pt will ambulate 25 ft with LRAD & min assist +1. PT Short Term Goal 2 (Week 2): Pt will complete bed<>w/c via stand pivot with min assist +1. PT Short Term Goal 3 (Week 2): Pt will complete bed mobility with bed flat, without rails with min assist.  Skilled Therapeutic Interventions/Progress Updates:   Pt in w/c and agreeable to therapy, no c/o pain. Requesting to toilet. Pt discussed technique for stand pivot to toilet that she learned w/ OT, pt able to correctly direct her care w/ 1 verbal cue for safety of BUE placement. Min-mod stand pivot to commode w/ max-total assist for LE garment management. Supervision w/ pericare. Pt discussed that she still uses bedpan at night, problem solved how she can get to [facility] during night to continue working on functional strengthening. Discussed w/ primary OT and RN as well, changed safety plan to stedy +1 transfer to [facility]. Pt self-propelled w/c to/from nurses station, 150', w/ supervision using BUEs to work on functional independence. Returned to room and mod assist stand pivot to EOB w/ verbal and tactile cues for technique and BUE placement. Ended session in supine, all needs in reach.   Therapy Documentation Precautions:  Precautions Precautions: Fall, Other (comment) Precaution Comments: monitor HR, SpO2 Restrictions Weight Bearing Restrictions: No Vital Signs: Therapy Vitals Temp: (!) 97.5 F (36.4 C) Temp Source: Oral Pulse Rate: (!) 106 Resp: 19 BP: 108/90 Patient Position (if appropriate): Sitting Oxygen Therapy SpO2: 96 % O2 Device: Room Air  Therapy/Group: Individual Therapy  [name] Clent Demark 07/29/2019, 3:23 PM

## 2019-07-29 NOTE — Progress Notes (Signed)
Patient refused CPAP tonight 

## 2019-07-29 NOTE — Patient Care Conference (Signed)
Inpatient RehabilitationTeam Conference and Plan of Care Update Date: 07/29/2019   Time: 10:30 AM    Patient Name: Vanessa Mack      Medical Record Number: [id number]  Date of Birth: 12-25-1950 Sex: Female         Room/Bed: 4W02C/4W02C-01 Payor Info: Payor: MEDICARE / Plan: MEDICARE PART A AND B / Product Type: *No Product type* /    Admit Date/Time:  07/22/2019  1:50 PM  Primary Diagnosis:  Physical debility  Patient Active Problem List   Diagnosis Date Noted  . Physical debility 07/22/2019    Expected Discharge Date: Expected Discharge Date: 08/20/19  Team Members Present: Physician leading conference: Dr. Alger Simons Social Worker Present: Lennart Pall, LCSW Nurse Present: Dorthula Nettles, RN Case Manager: Karene Fry, RN PT Present: Lavone Nian, PT OT Present: Cherylynn Ridges, OT SLP Present: Weston Anna, SLP PPS Coordinator present : Gunnar Fusi, SLP     Current Status/Progress Goal Weekly Team Focus  Bowel/Bladder   continent of bowel and bladder, LBM 11/21 per chart, but 11/23 per pt  patient will remain continent of bowel and bladder  toilet prn, laxatives prn   Swallow/Nutrition/ Hydration             ADL's   Mod/max A sit<>stand, Mod/Max Stand-pivot, Min A UB ADLs, Max A LB ADLs  supervision overall  sit<>stand, activity tolerance, transfers, self-care retraining, general strengthening   Mobility   min assist slide board transfers, w/c mobility supervision, min<>supervision bed mobility, very motivated  supervision overall  transfers, standing tolerance, bed mobility, w/c mobility, endurance training, strengthening, balance   Communication             Safety/Cognition/ Behavioral Observations            Pain   patient complains of generalized pain, as well as neck and back pain; oxycodone and tylenol PRN  pain < or = 4  give pain meds prn   Skin   healing stage 2 to coccyx, pinpoint red spot above anus, MASD groin  continued healing of stage  2, improvement of MASD, and not further skin breakdown or infection  assess skin q shift    Rehab Goals Patient on target to meet rehab goals: Yes *See Care Plan and progress notes for long and short-term goals.     Barriers to Discharge  Current Status/Progress Possible Resolutions Date Resolved   Nursing                  PT  Medical stability;Decreased caregiver support  increased HR with mobility, unsure if she has 24 hr support at d/c              OT                  SLP                SW                Discharge Planning/Teaching Needs:  Pt home with adult sons providing needed support.  Teaching to be planned.   Team Discussion: Deconditioned, looking better, working on diabetes control.  RN - cont B/B, has urgency.  OT mod with steady, mod/max commode, ADLs mod/max A, L shoulder weak, dizzy,  BP decreased but then went back up.  PT min A slide board transfers, S bed, few steps yesterday, S goals.   Revisions to Treatment Plan: N/A     Medical Summary Current Status: COVID  pt, severe debility, improving stamina, tolerated steroid wean. sugars with borderline control Weekly Focus/Goal: improve stamina, regulate blood sugars, bp control  Barriers to Discharge: Medical stability   Possible Resolutions to Barriers: manage medical issues as per progress notes   Continued Need for Acute Rehabilitation Level of Care: The patient requires daily medical management by a physician with specialized training in physical medicine and rehabilitation for the following reasons: Direction of a multidisciplinary physical rehabilitation program to maximize functional independence : Yes Medical management of patient stability for increased activity during participation in an intensive rehabilitation regime.: Yes Analysis of laboratory values and/or radiology reports with any subsequent need for medication adjustment and/or medical intervention. : Yes   I attest that I was present, lead  the team conference, and concur with the assessment and plan of the team.   Jodell Cipro M 07/30/2019, 3:03 PM  Team conference was held via web/ teleconference due to [place] - 19

## 2019-07-29 NOTE — Progress Notes (Signed)
Physical Therapy Weekly Progress Note  Patient Details  Name: Vanessa Mack MRN: [id number] Date of Birth: 07/27/1951  Beginning of progress report period: July 23, 2019 End of progress report period: July 29, 2019  Today's Date: 07/29/2019  Patient has met 3 of 3 short term goals.  Pt is making good progress towards LTG's as pt is able to complete slide board transfers with min assist and was able to initiate gait training with RW yesterday with min assist + w/c follow for safety. Pt is very motivated to participate in therapy. Pt would benefit from continued skilled PT treatment to focus on endurance, strengthening, balance, standing tolerance, gait, cardiopulmonary endurance training, transfers, and bed mobility to increase independence with functional mobility.   Patient continues to demonstrate the following deficits muscle weakness, decreased cardiorespiratoy endurance, decreased coordination, and decreased sitting balance, decreased standing balance, decreased postural control and decreased balance strategies and therefore will continue to benefit from skilled PT intervention to increase functional independence with mobility.  Patient progressing toward long term goals..  Continue plan of care.  PT Short Term Goals Week 1:  PT Short Term Goal 1 (Week 1): Pt will perform bed mobility with min assist PT Short Term Goal 1 - Progress (Week 1): Met PT Short Term Goal 2 (Week 1): Pt will perform bed<>chair transfer with mod assist PT Short Term Goal 2 - Progress (Week 1): Met PT Short Term Goal 3 (Week 1): Pt will initiate gait training PT Short Term Goal 3 - Progress (Week 1): Met Week 2:  PT Short Term Goal 1 (Week 2): Pt will ambulate 25 ft with LRAD & min assist +1. PT Short Term Goal 2 (Week 2): Pt will complete bed<>w/c via stand pivot with min assist +1. PT Short Term Goal 3 (Week 2): Pt will complete bed mobility with bed flat, without rails with min assist.   Therapy  Documentation Precautions:  Precautions Precautions: Fall, Other (comment) Precaution Comments: monitor HR, SpO2 Restrictions Weight Bearing Restrictions: No   Therapy/Group: Individual Therapy   M  07/29/2019, 8:03 AM   

## 2019-07-29 NOTE — Progress Notes (Signed)
Physical Therapy Session Note  Patient Details  Name: Vanessa Mack MRN: [id number] Date of Birth: 27-Nov-1950  Today's Date: 07/29/2019 PT Individual Time: 0807-0900 PT Individual Time Calculation (min): 53 min   Short Term Goals: Week 2:  PT Short Term Goal 1 (Week 2): Pt will ambulate 25 ft with LRAD & min assist +1. PT Short Term Goal 2 (Week 2): Pt will complete bed<>w/c via stand pivot with min assist +1. PT Short Term Goal 3 (Week 2): Pt will complete bed mobility with bed flat, without rails with min assist.  Skilled Therapeutic Interventions/Progress Updates:  Pt received in bed & agreeable to tx. Pt transferred supine>sitting EOB with supervision and hospital bed features. Pt transfers bed>w/c with stedy with max assist to power up from elevated EOB. Transported pt to gym via w/c dependent assist and pt transfers sit>stand with max assist and multiple attempts with RW. Pt completes stand pivot w/c>mat table with RW & min/mod assist with pt appearing weak during transfer and therapist assisting to safely sit on side of mat with pt reporting she felt lightheaded during the transfer. After rest break, vitals: BP = 131/90 mmHg (RUE, sitting), HR = 135 bpm. Pt given rest break to taken meds from RN. Pt performs sit<>stand from elevated EOM with RW & task focusing on BLE strengthening and strengthening during transitional, functional movement. Pt's HR increased to 147 bpm after 2 reps & given prolonged rest break but even with rest & pursed lip breathing pt's HR remained 141-145 bpm sitting EOM. Pt transferred sit>stand and stand pivot mat>w/c with RW & mod assist with pt reporting lightheadedness during transfer. Pt assisted to w/c and BP = 129/92 mmHg (RUE, sitting), HR = 139-141 bpm, SpO2 = 98%. Pt propels w/c back to room with BUE & supervision for cardiopulmonary endurance training & BUE strengthening. At end of session pt left in w/c with chair alarm donned & all needs in  reach.  Therapy Documentation Precautions:  Precautions Precautions: Fall, Other (comment) Precaution Comments: monitor HR, SpO2 Restrictions Weight Bearing Restrictions: No  Pain: 6/10 in back & knees (chronic pain) - RN administered pain meds at beginning of session.  Therapy/Group: Individual Therapy  Waunita Schooner 07/29/2019, 9:03 AM

## 2019-07-29 NOTE — Progress Notes (Signed)
Occupational Therapy Weekly Progress Note  Patient Details  Name: Vanessa Mack MRN: [id number] Date of Birth: 10-23-1950  Beginning of progress report period: July 23, 2019 End of progress report period: July 29, 2019  Today's Date: 07/29/2019  Session 1 OT Individual Time: 2694-8546 OT Individual Time Calculation (min): 45 min   Session 2 OT Individual Time: 1300-1400 OT Individual Time Calculation (min): 60 min    Patient has met 3 of 3 short term goals.  Pt is making steady progress towards OT goals. Pt is able to complete stand-pivot transfers to the toilet with mod A overall- but occasionally needs max A pending fatigue. Pt's sit<>stands have also improved to a 1 person assist mod/max again pending fatigue. Pt is tolerating therapy sessions well and is very motivated to return to PLOF.  Patient continues to demonstrate the following deficits: muscle weakness, decreased cardiorespiratoy endurance and decreased oxygen support and decreased standing balance, decreased postural control and decreased balance strategies and therefore will continue to benefit from skilled OT intervention to enhance overall performance with BADL and Reduce care partner burden.  Patient progressing toward long term goals..  Continue plan of care.  OT Short Term Goals Week 1:  OT Short Term Goal 1 (Week 1): Pt will perform toilet transfer with mod A OT Short Term Goal 1 - Progress (Week 1): Met OT Short Term Goal 2 (Week 1): Pt will thread LB clothing with mod A sit to stand OT Short Term Goal 2 - Progress (Week 1): Met OT Short Term Goal 3 (Week 1): Pt will perform sit to stand with mod A consistently OT Short Term Goal 3 - Progress (Week 1): Met Week 2:  OT Short Term Goal 1 (Week 2): Pt will maintain standing for 3 minutes at the sink in preparation for BADL task. OT Short Term Goal 2 (Week 2): Pt will maintain standing while pulling up pants with min A for balance OT Short Term Goal 3  (Week 2): Pt will thread B LEs into pants using AE as needed  Skilled Therapeutic Interventions/Progress Updates:  Session 1   Pt greeted sitting in wc and agreeable to OT treatment session. Per discussion with PT, pt was dizzy in standing today. Orthostatic vitals taken as detailed below. Sit<>stand at the sink with max A. Pttolerated standing for 2 minutes while taking BP. Used mirror feedback to encourage upright posture. UB bathing/dressing completed from wc at the sink with supervision. Pt able to brush hair today, then OT braided pt's hair. Pt left seated in wc with alarm belt on and needs met.  Session 2 Pt greeted sitting in wc after lunch and agreeable to OT treatment session. Pt reported need to go to the bathroom. Pt completed stand-pivot to raised commode over toilet with mod A and use of grab bars. Pt needed OT assist to pull down pants. Pt voided bladder and completed peri-care with mod A in squatting. Pt brought to therapy gym and completed 5 mins x2 on [employer] arm bike on level 2.5. Pt brought into standing at high-low table. Pt tolerated standing for 1.5 minute intervals while using BUEs to complete beading task. OT issued green foam to work on L girp strength. OT also provided pt with coloring worksheets and word puzzle along with colored pencils. Pt left seated in wc with call bell in reach, alarm belt on, and needs met.  Therapy Documentation Precautions:  Precautions Precautions: Fall, Other (comment) Precaution Comments: monitor HR, SpO2 Restrictions Weight Bearing Restrictions:  No Pain: Denies pain  Therapy/Group: Individual Therapy  Valma Cava 07/29/2019, 2:02 PM

## 2019-07-29 NOTE — Progress Notes (Signed)
[place] PHYSICAL MEDICINE & REHABILITATION PROGRESS NOTE   Subjective/Complaints: No new complaints. In good spirits. On her way to gym with PT. Slept well last night. Stamina improving  ROS: Patient denies fever, rash, sore throat, blurred vision, nausea, vomiting, diarrhea, cough,   chest pain, joint or back pain, headache, or mood change.    Objective:   No results found. Recent Labs    07/28/19 0652  WBC 11.3*  HGB 13.0  HCT 40.5  PLT 151   Recent Labs    07/28/19 0652  NA 141  K 4.0  CL 104  CO2 26  GLUCOSE 109*  BUN 15  CREATININE 0.49  CALCIUM 9.2    Intake/Output Summary (Last 24 hours) at 07/29/2019 0917 Last data filed at 07/29/2019 [id number] Gross per 24 hour  Intake 924 ml  Output -  Net 924 ml     Physical Exam: Vital Signs Blood pressure 128/75, pulse 97, temperature 98.2 F (36.8 C), temperature source Oral, resp. rate 15, height 5\' 7"  (1.702 m), weight 94.4 kg, SpO2 96 %.  Constitutional: No distress . Vital signs reviewed. HEENT: EOMI, oral membranes moist Neck: supple Cardiovascular: RRR without murmur. No JVD    Respiratory: CTA Bilaterally without wheezes or rales. Normal effort    GI: BS +, non-tender, non-distended   Extremities: No clubbing, cyanosis, or edema. Pulses are 2+ Skin: Clean and intact without signs of breakdown feet remain dry. Did not examine ano-rectal area as was on way to gym Neuro: Pt is cognitively appropriate with normal insight, memory, and awareness. Cranial nerves 2-12 are intact. Sensory exam is normal. Reflexes are 1+ in all 4's. Fine motor coordination is intact. No tremors. Motor function is grossly 4/5 UE, 3+/5 HF, 3+/5 KE and 4/5 ADF/PF.--motor exam slow improving.  Musculoskeletal: functional ROM. No obvious areas of pain Psych: very pleasant      Assessment/Plan: 1. Functional deficits secondary to debility after COVID which require 3+ hours per day of interdisciplinary therapy in a comprehensive  inpatient rehab setting.  Physiatrist is providing close team supervision and 24 hour management of active medical problems listed below.  Physiatrist and rehab team continue to assess barriers to discharge/monitor patient progress toward functional and medical goals  Care Tool:  Bathing    Body parts bathed by patient: Right arm, Left arm, Chest, Abdomen, Face   Body parts bathed by helper: Front perineal area, Buttocks, Right upper leg, Left upper leg, Right lower leg, Left lower leg     Bathing assist Assist Level: Maximal Assistance - Patient 24 - 49%     Upper Body Dressing/Undressing Upper body dressing   What is the patient wearing?: Pull over shirt    Upper body assist Assist Level: Minimal Assistance - Patient > 75%    Lower Body Dressing/Undressing Lower body dressing      What is the patient wearing?: Pants     Lower body assist Assist for lower body dressing: Minimal Assistance - Patient > 75%(standing with Charlaine Dalton)     Toileting Toileting    Toileting assist Assist for toileting: Maximal Assistance - Patient 25 - 49%     Transfers Chair/bed transfer  Transfers assist  Chair/bed transfer activity did not occur: Safety/medical concerns  Chair/bed transfer assist level: Moderate Assistance - Patient 50 - 74%     Locomotion Ambulation   Ambulation assist   Ambulation activity did not occur: Safety/medical concerns  Assist level: 2 helpers Assistive device: Walker-rolling Max distance: 5 ft  Walk 10 feet activity   Assist  Walk 10 feet activity did not occur: Safety/medical concerns  Assist level: Total Assistance - Patient < 25% Assistive device: Walker-rolling, Maxi Sky   Walk 50 feet activity   Assist Walk 50 feet with 2 turns activity did not occur: Safety/medical concerns         Walk 150 feet activity   Assist Walk 150 feet activity did not occur: Safety/medical concerns         Walk 10 feet on uneven surface   activity   Assist Walk 10 feet on uneven surfaces activity did not occur: Safety/medical concerns         Wheelchair     Assist Will patient use wheelchair at discharge?: Yes Type of Wheelchair: Manual Wheelchair activity did not occur: Safety/medical concerns  Wheelchair assist level: Supervision/Verbal cueing Max wheelchair distance: 150 ft    Wheelchair 50 feet with 2 turns activity    Assist    Wheelchair 50 feet with 2 turns activity did not occur: Safety/medical concerns   Assist Level: Supervision/Verbal cueing   Wheelchair 150 feet activity     Assist  Wheelchair 150 feet activity did not occur: Safety/medical concerns   Assist Level: Supervision/Verbal cueing   Blood pressure 128/75, pulse 97, temperature 98.2 F (36.8 C), temperature source Oral, resp. rate 15, height 5\' 7"  (1.702 m), weight 94.4 kg, SpO2 96 %.  Medical Problem List and Plan: 1. Impaired mobility and ADLs secondary to COVID-19  --Continue CIR therapies including PT, OT     --Interdisciplinary Team Conference today    2. Antithrombotics: -DVT/anticoagulation:Pharmaceutical:Lovenox -antiplatelet therapy: N/A 3.Chronic LBP/Pain Management:Currently 7/10. Oxycodone 5mg  prn. Apply heating pads 3 times per day PRN for low back pain. Continue lidocaine patch. Was on Oxycodone 15mg  PRN PTA.  4. Mood:LCSW to follow for evaluation and support. Team to provide ego support. -antipsychotic agents: N/A 5. Neuropsych: This patientiscapable of making decisions on herown behalf. 6. Skin/Wound Care:Routine pressure relief measures.  -pinpoint anal lesion: frequent repositioning, OOB, has been discussed with pt 7. Fluids/Electrolytes/Nutrition:encourage PO  -mild hypokalemia-continue supplement.  -11/23: hypokalemia resolved. 4.0. potassium supp dc'ed   -continue protein supplements for low albumin 8. COPD: Has been on IV solumedrol --->prednisone tapered  to off 9. T2DM with neuropathy: Continue Lantus 30 units daily.   -was on metformin xr at home pta  -began amaryl 1mg  daily on 11/20  -now off steroids  -continue SSI for coverage  11/24-overall much improved. Still a bit inconsistent, no changes today 10. H/o depression: Monitor for now--not on any meds at this time. Seems very motivated 11. HTN: Monitor BP tid--currently on metoprolol 25mg  BID and controlled 11/24 12. OSA: CPAP at nights with 2L oxygen bled in.  13. Insomnia: improved   -on trazodone 300mg  at home  - scheduled 50mg  trazodone at HS with good results 14. Anxiety: Xanax 0.125mg  BID PRN. Was on 1mg  BID PTA.  15. HLD: Continue atorvastatin 10mg  HS. 16. Leukocytosis d/t steroids  -steroid taper  - 11.3 WBC on 11/23    LOS: 7 days A FACE TO [place] 07/29/2019, 9:17 AM

## 2019-07-30 ENCOUNTER — Inpatient Hospital Stay (HOSPITAL_COMMUNITY): Payer: Medicare Other | Admitting: Physical Therapy

## 2019-07-30 ENCOUNTER — Inpatient Hospital Stay (HOSPITAL_COMMUNITY): Payer: Medicare Other

## 2019-07-30 DIAGNOSIS — R5381 Other malaise: Secondary | ICD-10-CM | POA: Diagnosis not present

## 2019-07-30 LAB — GLUCOSE, CAPILLARY
Glucose-Capillary: 140 mg/dL — ABNORMAL HIGH (ref 70–99)
Glucose-Capillary: 117 mg/dL — ABNORMAL HIGH (ref 70–99)
Glucose-Capillary: 68 mg/dL — ABNORMAL LOW (ref 70–99)
Glucose-Capillary: 86 mg/dL (ref 70–99)
Glucose-Capillary: 153 mg/dL — ABNORMAL HIGH (ref 70–99)

## 2019-07-30 MED ORDER — TRAZODONE HCL 50 MG PO TABS
100.0000 mg | ORAL_TABLET | Freq: Every day | ORAL | Status: DC
  Administered 2019-07-30 – 2019-08-01 (×3): 100 mg via ORAL
  Filled 2019-07-30 (×3): qty 2

## 2019-07-30 NOTE — Progress Notes (Signed)
[place] PHYSICAL MEDICINE & REHABILITATION PROGRESS NOTE   Subjective/Complaints: Refused CPAP overnight.  No new complaints. In good spirits. On her way to gym with PT. Slept well last night. Stamina improving Vitals stable. She tells me that she was diagnosed with COVID in January, when she had hematemesis, and asks how she may have gotten it again. She continues to have chronic back pain but no acute pain. Has muscle soreness which she knows is a sign that she is working hard.  Had a BM this morning. Continues to sleep well at night.   ROS: Patient denies fever, rash, sore throat, blurred vision, nausea, vomiting, diarrhea, cough,   chest pain, joint or back pain, headache, or mood change.    Objective:   No results found. Recent Labs    07/28/19 0652  WBC 11.3*  HGB 13.0  HCT 40.5  PLT 151   Recent Labs    07/28/19 0652  NA 141  K 4.0  CL 104  CO2 26  GLUCOSE 109*  BUN 15  CREATININE 0.49  CALCIUM 9.2    Intake/Output Summary (Last 24 hours) at 07/30/2019 0801 Last data filed at 07/29/2019 1820 Gross per 24 hour  Intake 460 ml  Output -  Net 460 ml     Physical Exam: Vital Signs Blood pressure 112/72, pulse 100, temperature 98.4 F (36.9 C), temperature source Oral, resp. rate 15, height 5\' 7"  (1.702 m), weight 95 kg, SpO2 95 %.  Constitutional: No distress . Vital signs reviewed. Sitting up in wheelchair.  HEENT: EOMI, oral membranes moist Neck: supple Cardiovascular: RRR without murmur. No JVD    Respiratory: CTA Bilaterally without wheezes or rales. Normal effort    GI: BS +, non-tender, non-distended   Extremities: No clubbing, cyanosis, or edema. Pulses are 2+ Skin: Clean and intact without signs of breakdown feet remain dry. Did not examine ano-rectal area as was on way to gym Neuro: Pt is cognitively appropriate with normal insight, memory, and awareness. Cranial nerves 2-12 are intact. Sensory exam is normal. Reflexes are 1+ in all 4's. Fine  motor coordination is intact. No tremors. Motor function is grossly 4/5 UE, 3+/5 HF, 3+/5 KE and 4/5 ADF/PF.--motor exam slow improving.  Musculoskeletal: functional ROM. No obvious areas of pain Psych: very pleasant  Assessment/Plan: 1. Functional deficits secondary to debility after COVID which require 3+ hours per day of interdisciplinary therapy in a comprehensive inpatient rehab setting.  Physiatrist is providing close team supervision and 24 hour management of active medical problems listed below.  Physiatrist and rehab team continue to assess barriers to discharge/monitor patient progress toward functional and medical goals  Care Tool:  Bathing    Body parts bathed by patient: Right arm, Left arm, Chest, Abdomen, Face   Body parts bathed by helper: Front perineal area, Buttocks, Right upper leg, Left upper leg, Right lower leg, Left lower leg     Bathing assist Assist Level: Maximal Assistance - Patient 24 - 49%     Upper Body Dressing/Undressing Upper body dressing   What is the patient wearing?: Pull over shirt    Upper body assist Assist Level: Minimal Assistance - Patient > 75%    Lower Body Dressing/Undressing Lower body dressing      What is the patient wearing?: Pants     Lower body assist Assist for lower body dressing: Minimal Assistance - Patient > 75%(standing with Charlaine Dalton)     Toileting Toileting    Toileting assist Assist for toileting: Maximal  Assistance - Patient 25 - 49%     Transfers Chair/bed transfer  Transfers assist  Chair/bed transfer activity did not occur: Safety/medical concerns  Chair/bed transfer assist level: Moderate Assistance - Patient 50 - 74%     Locomotion Ambulation   Ambulation assist   Ambulation activity did not occur: Safety/medical concerns  Assist level: 2 helpers Assistive device: Walker-rolling Max distance: 5 ft   Walk 10 feet activity   Assist  Walk 10 feet activity did not occur: Safety/medical  concerns  Assist level: Total Assistance - Patient < 25% Assistive device: Walker-rolling, Maxi Sky   Walk 50 feet activity   Assist Walk 50 feet with 2 turns activity did not occur: Safety/medical concerns         Walk 150 feet activity   Assist Walk 150 feet activity did not occur: Safety/medical concerns         Walk 10 feet on uneven surface  activity   Assist Walk 10 feet on uneven surfaces activity did not occur: Safety/medical concerns         Wheelchair     Assist Will patient use wheelchair at discharge?: Yes Type of Wheelchair: Manual Wheelchair activity did not occur: Safety/medical concerns  Wheelchair assist level: Supervision/Verbal cueing Max wheelchair distance: 150'    Wheelchair 50 feet with 2 turns activity    Assist    Wheelchair 50 feet with 2 turns activity did not occur: Safety/medical concerns   Assist Level: Supervision/Verbal cueing   Wheelchair 150 feet activity     Assist  Wheelchair 150 feet activity did not occur: Safety/medical concerns   Assist Level: Supervision/Verbal cueing   Blood pressure 112/72, pulse 100, temperature 98.4 F (36.9 C), temperature source Oral, resp. rate 15, height 5\' 7"  (1.702 m), weight 95 kg, SpO2 95 %.  Medical Problem List and Plan: 1. Impaired mobility and ADLs secondary to COVID-19  --Continue CIR therapies including PT, OT     --Interdisciplinary Team Conference today     -Answered patient's questions regarding her COVID-19 diagnosis. Explained that antibodies to the virus are believed to last 4-6 months so it is possible for her to have had the virus in January and been reinfected.  2. Antithrombotics: -DVT/anticoagulation:Pharmaceutical:Lovenox -antiplatelet therapy: N/A 3.Chronic LBP/Pain Management:Currently 7/10. Oxycodone 5mg  prn. Apply heating pads 3 times per day PRN for low back pain. Continue lidocaine patch. Was on Oxycodone 15mg  PRN PTA.  4.  Mood:LCSW to follow for evaluation and support. Team to provide ego support. -antipsychotic agents: N/A 5. Neuropsych: This patientiscapable of making decisions on herown behalf. 6. Skin/Wound Care:Routine pressure relief measures.  -pinpoint anal lesion: frequent repositioning, OOB, has been discussed with pt 7. Fluids/Electrolytes/Nutrition:encourage PO  -mild hypokalemia-continue supplement.  -11/23: hypokalemia resolved. 4.0. potassium supp dc'ed   -continue protein supplements for low albumin 8. COPD: Has been on IV solumedrol --->prednisone tapered to off 9. T2DM with neuropathy: Continue Lantus 30 units daily.   -was on metformin xr at home pta  -began amaryl 1mg  daily on 11/20  -now off steroids  -continue SSI for coverage  11/24-overall much improved. Still a bit inconsistent, no changes today 10. H/o depression: Monitor for now--not on any meds at this time. Seems very motivated 11. HTN: Monitor BP tid--currently on metoprolol 25mg  BID and controlled 11/24 12. OSA: CPAP at nights with 2L oxygen bled in.  13. Insomnia: improved   -on trazodone 300mg  at home  - scheduled 50mg  trazodone at HS with good results  -11/25: She  complains of poor sleep at night, will increase her Trazodone dose to 100mg  HS.  14. Anxiety: Xanax 0.125mg  BID PRN. Was on 1mg  BID PTA.  15. HLD: Continue atorvastatin 10mg  HS. 16. Leukocytosis d/t steroids  -steroid taper  - 11.3 WBC on 11/23    LOS: 8 days A FACE TO FACE EVALUATION WAS PERFORMED  Carlis Burnsworth P Selim Durden 07/30/2019, 8:01 AM

## 2019-07-30 NOTE — Progress Notes (Signed)
Occupational Therapy Session Note  Patient Details  Name: Vanessa Mack MRN: [id number] Date of Birth: [birth year]  Today's Date: 07/30/2019 OT Individual Time: 1000-1100 OT Individual Time Calculation (min): 60 min    Short Term Goals: Week 1:  OT Short Term Goal 1 (Week 1): Pt will perform toilet transfer with mod A OT Short Term Goal 1 - Progress (Week 1): Met OT Short Term Goal 2 (Week 1): Pt will thread LB clothing with mod A sit to stand OT Short Term Goal 2 - Progress (Week 1): Met OT Short Term Goal 3 (Week 1): Pt will perform sit to stand with mod A consistently OT Short Term Goal 3 - Progress (Week 1): Met Week 2:  OT Short Term Goal 1 (Week 2): Pt will maintain standing for 3 minutes at the sink in preparation for BADL task. OT Short Term Goal 2 (Week 2): Pt will maintain standing while pulling up pants with min A for balance OT Short Term Goal 3 (Week 2): Pt will thread B LEs into pants using AE as needed  Skilled Therapeutic Interventions/Progress Updates:    1;1. Pt agreeable to bathing and dressing at shower level with pain 7/10 but premedicated. Repositioned at end of session for comfort. Pt completes sit to stand in stedy with MOD A overall with manual facilitation of weight shift forward for all transfers/clothing management during dressing. Pt completes bathing with A to wash B feet, back and buttocks. Pt HR 119 after bathing and OT cues to rest back on back rest and OT washes hair. Pt dresses sit to stand from w/c with A to advance underwear and brief past hips. Pt benefts from threading head first to don shirt with S. Pt OT dries and styles hair after pt combs. Exited session with pt seated in w/c call light inr each and all need smet  Therapy Documentation Precautions:  Precautions Precautions: Fall, Other (comment) Precaution Comments: monitor HR, SpO2 Restrictions Weight Bearing Restrictions: No General:   Vital Signs:   Pain:   ADL: ADL Equipment  Provided: Leg straps Grooming: Minimal assistance Upper Body Bathing: Minimal assistance Lower Body Bathing: Maximal assistance Where Assessed-Lower Body Bathing: Edge of bed Upper Body Dressing: Minimal assistance Where Assessed-Upper Body Dressing: Edge of bed Lower Body Dressing: Dependent Where Assessed-Lower Body Dressing: Edge of bed Toileting: Dependent Where Assessed-Toileting: Glass blower/designer: Maximal Print production planner Method: Nutritional therapist: Not assessed Vision   Perception    Praxis   Exercises:   Other Treatments:     Therapy/Group: Individual Therapy  Tonny Branch 07/30/2019, 10:59 AM

## 2019-07-30 NOTE — Progress Notes (Signed)
Physical Therapy Session Note  Patient Details  Name: Vanessa Mack MRN: [id number] Date of Birth: [birth year]  Today's Date: 07/30/2019 PT Individual Time: 1455-1535 PT Individual Time Calculation (min): 40 min   Short Term Goals: Week 2:  PT Short Term Goal 1 (Week 2): Pt will ambulate 25 ft with LRAD & min assist +1. PT Short Term Goal 2 (Week 2): Pt will complete bed<>w/c via stand pivot with min assist +1. PT Short Term Goal 3 (Week 2): Pt will complete bed mobility with bed flat, without rails with min assist.  Skilled Therapeutic Interventions/Progress Updates:    Pt received seated in w/c in room, agreeable to PT session. Pt reports whole body soreness, not rated and declines intervention. Manual w/c propulsion x 150 ft with use of BUE and intermittent use of BLE before onset of fatigue. Sit to stand with min A in // bars progressing to mod A with onset of fatigue. Standing alt L/R marches x 10 reps each with BUE support and min A for balance. Standing mini-squats x 5 reps before onset of fatigue. Sit to stand x 5 reps to // bars with one UE on w/c armrest and one on // bar with min to mod A. Pt appears SOA following standing activity, reviewed pursed lip breathing techniques. SpO2 98% on room air with activity, HR 130. Pt requests to return to bed at end of session. Stedy transfer back to bed due to pt fatigue. Pt is mod A to stand to stedy from w/c seat. Sit to supine Supervision once in bed. Pt left semi-reclined in bed with needs in reach at end of session.  Therapy Documentation Precautions:  Precautions Precautions: Fall, Other (comment) Precaution Comments: monitor HR, SpO2 Restrictions Weight Bearing Restrictions: No    Therapy/Group: Individual Therapy   Excell Seltzer, PT, DPT  07/30/2019, 3:42 PM

## 2019-07-30 NOTE — Progress Notes (Signed)
Occupational Therapy Session Note  Patient Details  Name: Vanessa Mack MRN: [id number] Date of Birth: 06/12/1951  Today's Date: 07/30/2019 OT Individual Time: 9628-3662 OT Individual Time Calculation (min): 72 min    Short Term Goals: Week 1:  OT Short Term Goal 1 (Week 1): Pt will perform toilet transfer with mod A OT Short Term Goal 1 - Progress (Week 1): Met OT Short Term Goal 2 (Week 1): Pt will thread LB clothing with mod A sit to stand OT Short Term Goal 2 - Progress (Week 1): Met OT Short Term Goal 3 (Week 1): Pt will perform sit to stand with mod A consistently OT Short Term Goal 3 - Progress (Week 1): Met  Skilled Therapeutic Interventions/Progress Updates:    1;1. Pt received in w/c eating lunch with no pain reported. Pt agreeable to tx. Pt completes w/c propulsion with BUE and LE for reciprocal movement training and endurance to/from all tx spaces. Pt completes w/c bowling 2 games (one with each UE "throwing" ball). Pt stands in stedy wit MOD A for sit to stand and remains standing for 2 frames. HR checked and pt cued to rest d/t elevated HR. Remainder of frames bowled in seated d/t vitals. Pt reporting feeling like BUE have been "workedSecretary/administrator activity. Pt able to achieve 100* shoulder flex on RUE and 90* LUE during swing. Pt completes 2x8 reps dowel rod therex for global strength required for BADLs. Exited session with pt seated in recliner with call light tinr each  HR sitting after w/c mobility 118 HR after standin bowling 143 HR after rest break perched on stedy flaps 134 Seated rest 117  Therapy Documentation Precautions:  Precautions Precautions: Fall, Other (comment) Precaution Comments: monitor HR, SpO2 Restrictions Weight Bearing Restrictions: No General:   Vital Signs: Therapy Vitals Temp: 98.2 F (36.8 C) Temp Source: Oral Pulse Rate: (!) 114 Resp: 20 BP: 126/73 Patient Position (if appropriate): Sitting Oxygen Therapy SpO2: 100 % O2  Device: Room Air Pain:   ADL: ADL Equipment Provided: Leg straps Grooming: Minimal assistance Upper Body Bathing: Minimal assistance Lower Body Bathing: Maximal assistance Where Assessed-Lower Body Bathing: Edge of bed Upper Body Dressing: Minimal assistance Where Assessed-Upper Body Dressing: Edge of bed Lower Body Dressing: Dependent Where Assessed-Lower Body Dressing: Edge of bed Toileting: Dependent Where Assessed-Toileting: Glass blower/designer: Maximal Print production planner Method: Nutritional therapist: Not assessed Vision   Perception    Praxis   Exercises:   Other Treatments:     Therapy/Group: Individual Therapy  Tonny Branch 07/30/2019, 1:25 PM

## 2019-07-30 NOTE — Progress Notes (Signed)
Social Work Patient ID: Vanessa Mack, female   DOB: 1951-06-14, 68 y.o.   MRN: [id number]   Have reviewed team conference with pt and son, [name].  Both aware and agreeable with targeted d/c date of 12/16 and supervision to min assist goals overall.  Both very pleased with progress this week!  Continue to follow.  Johnay Mano, LCSW

## 2019-07-31 ENCOUNTER — Inpatient Hospital Stay (HOSPITAL_COMMUNITY): Payer: Medicare Other | Admitting: Occupational Therapy

## 2019-07-31 DIAGNOSIS — R5381 Other malaise: Secondary | ICD-10-CM | POA: Diagnosis not present

## 2019-07-31 LAB — GLUCOSE, CAPILLARY
Glucose-Capillary: 121 mg/dL — ABNORMAL HIGH (ref 70–99)
Glucose-Capillary: 127 mg/dL — ABNORMAL HIGH (ref 70–99)
Glucose-Capillary: 146 mg/dL — ABNORMAL HIGH (ref 70–99)
Glucose-Capillary: 133 mg/dL — ABNORMAL HIGH (ref 70–99)

## 2019-07-31 NOTE — Progress Notes (Signed)
[place] PHYSICAL MEDICINE & REHABILITATION PROGRESS NOTE   Subjective/Complaints: Complains of increased pain this morning. Feels "bone pain" in both legs and feels it is from working with therapy.   Denies constipation. Feels her numbness in her legs has decreased.   ROS: Patient denies fever, rash, sore throat, blurred vision, nausea, vomiting, diarrhea, cough,   chest pain, joint or back pain, headache, or mood change.    Objective:   No results found. No results for input(s): WBC, HGB, HCT, PLT in the last 72 hours. No results for input(s): NA, K, CL, CO2, GLUCOSE, BUN, CREATININE, CALCIUM in the last 72 hours.  Intake/Output Summary (Last 24 hours) at 07/31/2019 0955 Last data filed at 07/31/2019 0750 Gross per 24 hour  Intake 702 ml  Output -  Net 702 ml     Physical Exam: Vital Signs Blood pressure 117/63, pulse (!) 109, temperature 98.4 F (36.9 C), resp. rate 18, height 5\' 7"  (1.702 m), weight 95 kg, SpO2 97 %.  Constitutional: No distress . Vital signs reviewed. Sitting up in wheelchair.  HEENT: EOMI, oral membranes moist Neck: supple Cardiovascular: RRR without murmur. No JVD    Respiratory: CTA Bilaterally without wheezes or rales. Normal effort    GI: BS +, non-tender, non-distended   Extremities: No clubbing, cyanosis, or edema. Pulses are 2+ Skin: Clean and intact without signs of breakdown feet remain dry. Did not examine ano-rectal area as was on way to gym Neuro: Pt is cognitively appropriate with normal insight, memory, and awareness. Cranial nerves 2-12 are intact. Sensory exam is normal. Reflexes are 1+ in all 4's. Fine motor coordination is intact. No tremors. Motor function is grossly 4/5 UE, 3+/5 HF, 3+/5 KE and 4/5 ADF/PF.--motor exam slow improving.  Musculoskeletal: functional ROM. No obvious areas of pain Psych: very pleasant  Assessment/Plan: 1. Functional deficits secondary to debility after COVID which require 3+ hours per day of  interdisciplinary therapy in a comprehensive inpatient rehab setting.  Physiatrist is providing close team supervision and 24 hour management of active medical problems listed below.  Physiatrist and rehab team continue to assess barriers to discharge/monitor patient progress toward functional and medical goals  Care Tool:  Bathing    Body parts bathed by patient: Right arm, Left arm, Chest, Abdomen, Face   Body parts bathed by helper: Front perineal area, Buttocks, Right upper leg, Left upper leg, Right lower leg, Left lower leg     Bathing assist Assist Level: Maximal Assistance - Patient 24 - 49%     Upper Body Dressing/Undressing Upper body dressing   What is the patient wearing?: Pull over shirt    Upper body assist Assist Level: Minimal Assistance - Patient > 75%    Lower Body Dressing/Undressing Lower body dressing      What is the patient wearing?: Pants     Lower body assist Assist for lower body dressing: Minimal Assistance - Patient > 75%(standing with Charlaine Dalton)     Toileting Toileting    Toileting assist Assist for toileting: Maximal Assistance - Patient 25 - 49%     Transfers Chair/bed transfer  Transfers assist  Chair/bed transfer activity did not occur: Safety/medical concerns  Chair/bed transfer assist level: Dependent - mechanical lift     Locomotion Ambulation   Ambulation assist   Ambulation activity did not occur: Safety/medical concerns  Assist level: 2 helpers Assistive device: Walker-rolling Max distance: 5 ft   Walk 10 feet activity   Assist  Walk 10 feet activity did  not occur: Safety/medical concerns  Assist level: Total Assistance - Patient < 25% Assistive device: Walker-rolling, Maxi Sky   Walk 50 feet activity   Assist Walk 50 feet with 2 turns activity did not occur: Safety/medical concerns         Walk 150 feet activity   Assist Walk 150 feet activity did not occur: Safety/medical concerns         Walk  10 feet on uneven surface  activity   Assist Walk 10 feet on uneven surfaces activity did not occur: Safety/medical concerns         Wheelchair     Assist Will patient use wheelchair at discharge?: Yes Type of Wheelchair: Manual Wheelchair activity did not occur: Safety/medical concerns  Wheelchair assist level: Supervision/Verbal cueing Max wheelchair distance: 150'    Wheelchair 50 feet with 2 turns activity    Assist    Wheelchair 50 feet with 2 turns activity did not occur: Safety/medical concerns   Assist Level: Supervision/Verbal cueing   Wheelchair 150 feet activity     Assist  Wheelchair 150 feet activity did not occur: Safety/medical concerns   Assist Level: Supervision/Verbal cueing   Blood pressure 117/63, pulse (!) 109, temperature 98.4 F (36.9 C), resp. rate 18, height 5\' 7"  (1.702 m), weight 95 kg, SpO2 97 %.  Medical Problem List and Plan: 1. Impaired mobility and ADLs secondary to COVID-19  --Continue CIR therapies including PT, OT     --Interdisciplinary Team Conference today     -Answered patient's questions regarding her COVID-19 diagnosis. Explained that antibodies to the virus are believed to last 4-6 months so it is possible for her to have had the virus in January and been reinfected.  2. Antithrombotics: -DVT/anticoagulation:Pharmaceutical:Lovenox -antiplatelet therapy: N/A 3.Chronic LBP/Pain Management:Oxycodone 5mg  prn. Apply heating pads 3 times per day PRN for low back pain. Continue lidocaine patch. Was on Oxycodone 15mg  PRN PTA.   11/26: Pain persists. She is allergic to NSAIDs. Consider administering Tylenol with Oxycodone for pain for greater pain relief--put in general nursing order. 4. Mood:LCSW to follow for evaluation and support. Team to provide ego support. -antipsychotic agents: N/A 5. Neuropsych: This patientiscapable of making decisions on herown behalf. 6. Skin/Wound  Care:Routine pressure relief measures.  -pinpoint anal lesion: frequent repositioning, OOB, has been discussed with pt 7. Fluids/Electrolytes/Nutrition:encourage PO  -mild hypokalemia-continue supplement.  -11/23: hypokalemia resolved. 4.0. potassium supp dc'ed   -continue protein supplements for low albumin 8. COPD: Has been on IV solumedrol --->prednisone tapered to off 9. T2DM with neuropathy: Continue Lantus 30 units daily.   -was on metformin xr at home pta  -began amaryl 1mg  daily on 11/20  -now off steroids  -continue SSI for coverage  11/24-overall much improved. Still a bit inconsistent, no changes today 10. H/o depression: Monitor for now--not on any meds at this time. Seems very motivated 11. HTN: Monitor BP tid--currently on metoprolol 25mg  BID and controlled 11/24 12. OSA: CPAP at nights with 2L oxygen bled in.  13. Insomnia: improved   -on trazodone 300mg  at home  - scheduled 50mg  trazodone at HS with good results  -11/25: She complains of poor sleep at night, will increase her Trazodone dose to 100mg  HS.  14. Anxiety: Xanax 0.125mg  BID PRN. Was on 1mg  BID PTA.  15. HLD: Continue atorvastatin 10mg  HS. 16. Leukocytosis d/t steroids  -steroid taper  - 11.3 WBC on 11/23    LOS: 9 days A FACE TO [facility]  07/31/2019, 9:55 AM

## 2019-08-01 ENCOUNTER — Inpatient Hospital Stay (HOSPITAL_COMMUNITY): Payer: Medicare Other | Admitting: Physical Therapy

## 2019-08-01 ENCOUNTER — Inpatient Hospital Stay (HOSPITAL_COMMUNITY): Payer: Medicare Other | Admitting: Occupational Therapy

## 2019-08-01 DIAGNOSIS — E119 Type 2 diabetes mellitus without complications: Secondary | ICD-10-CM | POA: Diagnosis not present

## 2019-08-01 DIAGNOSIS — E876 Hypokalemia: Secondary | ICD-10-CM | POA: Diagnosis not present

## 2019-08-01 DIAGNOSIS — G479 Sleep disorder, unspecified: Secondary | ICD-10-CM | POA: Diagnosis not present

## 2019-08-01 DIAGNOSIS — R5381 Other malaise: Secondary | ICD-10-CM | POA: Diagnosis not present

## 2019-08-01 LAB — GLUCOSE, CAPILLARY
Glucose-Capillary: 125 mg/dL — ABNORMAL HIGH (ref 70–99)
Glucose-Capillary: 89 mg/dL (ref 70–99)
Glucose-Capillary: 96 mg/dL (ref 70–99)
Glucose-Capillary: 119 mg/dL — ABNORMAL HIGH (ref 70–99)

## 2019-08-01 MED ORDER — OXYCODONE HCL 5 MG PO TABS
5.0000 mg | ORAL_TABLET | ORAL | Status: DC | PRN
  Administered 2019-08-01 (×2): 10 mg via ORAL
  Administered 2019-08-01: 11:00:00 5 mg via ORAL
  Administered 2019-08-02 – 2019-08-11 (×37): 10 mg via ORAL
  Administered 2019-08-11: 5 mg via ORAL
  Administered 2019-08-11 – 2019-08-13 (×10): 10 mg via ORAL
  Administered 2019-08-13: 5 mg via ORAL
  Administered 2019-08-14 – 2019-08-15 (×8): 10 mg via ORAL
  Filled 2019-08-01 (×7): qty 2
  Filled 2019-08-01: qty 1
  Filled 2019-08-01 (×20): qty 2
  Filled 2019-08-01: qty 1
  Filled 2019-08-01 (×18): qty 2
  Filled 2019-08-01: qty 1
  Filled 2019-08-01 (×3): qty 2
  Filled 2019-08-01: qty 1
  Filled 2019-08-01 (×9): qty 2

## 2019-08-01 MED ORDER — ADULT MULTIVITAMIN W/MINERALS CH
1.0000 | ORAL_TABLET | Freq: Every day | ORAL | Status: DC
  Administered 2019-08-01 – 2019-08-15 (×15): 1 via ORAL
  Filled 2019-08-01 (×15): qty 1

## 2019-08-01 NOTE — Progress Notes (Signed)
Slept good. Complained of pain to right leg at HS and complained of pain all over this morning. Pain managed with PRN oxy IR. Vanessa Mack A

## 2019-08-01 NOTE — Progress Notes (Signed)
[facility] PHYSICAL MEDICINE & REHABILITATION PROGRESS NOTE   Subjective/Complaints: Has had pain in legs (right more than left typically). Sometimes pain is all over. Felt better after she got up and moved around a bit this AM  ROS: Patient denies fever, rash, sore throat, blurred vision, nausea, vomiting, diarrhea, cough,   chest pain,  headache, or mood change.    Objective:   No results found. No results for input(s): WBC, HGB, HCT, PLT in the last 72 hours. No results for input(s): NA, K, CL, CO2, GLUCOSE, BUN, CREATININE, CALCIUM in the last 72 hours.  Intake/Output Summary (Last 24 hours) at 08/01/2019 1006 Last data filed at 07/31/2019 1833 Gross per 24 hour  Intake 260 ml  Output -  Net 260 ml     Physical Exam: Vital Signs Blood pressure 115/80, pulse (!) 114, temperature 98.6 F (37 C), temperature source Oral, resp. rate 16, height 5\' 7"  (1.702 m), weight 93.6 kg, SpO2 97 %.  Constitutional: No distress . Vital signs reviewed. HEENT: EOMI, oral membranes moist Neck: supple Cardiovascular: tachy without murmur. No JVD    Respiratory: CTA Bilaterally without wheezes or rales. Normal effort    GI: BS +, non-tender, non-distended   Extremities: No clubbing, cyanosis, or edema. Pulses are 2+ Skin: Clean and intact without signs of breakdown feet remain dry. Did not examine ano-rectal area as was on way to gym Neuro: Pt is cognitively appropriate with normal insight, memory, and awareness. Cranial nerves 2-12 are intact. Sensory exam is normal. Reflexes are 1+ in all 4's. Fine motor coordination is intact. No tremors. Motor function is grossly 4/5 UE, 3+/5 HF, 3+/5 KE and 4/5 ADF/PF.--motor exam slow improving.  Musculoskeletal: normal AROM. Mild pain in legs with palpation Psych: very pleasant  Assessment/Plan: 1. Functional deficits secondary to debility after COVID which require 3+ hours per day of interdisciplinary therapy in a comprehensive inpatient rehab  setting.  Physiatrist is providing close team supervision and 24 hour management of active medical problems listed below.  Physiatrist and rehab team continue to assess barriers to discharge/monitor patient progress toward functional and medical goals  Care Tool:  Bathing    Body parts bathed by patient: Right arm, Left arm, Chest, Abdomen, Face   Body parts bathed by helper: Front perineal area, Buttocks, Right upper leg, Left upper leg, Right lower leg, Left lower leg     Bathing assist Assist Level: Maximal Assistance - Patient 24 - 49%     Upper Body Dressing/Undressing Upper body dressing   What is the patient wearing?: Pull over shirt    Upper body assist Assist Level: Minimal Assistance - Patient > 75%    Lower Body Dressing/Undressing Lower body dressing      What is the patient wearing?: Pants     Lower body assist Assist for lower body dressing: Minimal Assistance - Patient > 75%(standing with Charlaine Dalton)     Toileting Toileting    Toileting assist Assist for toileting: Maximal Assistance - Patient 25 - 49%     Transfers Chair/bed transfer  Transfers assist  Chair/bed transfer activity did not occur: Safety/medical concerns  Chair/bed transfer assist level: Dependent - mechanical lift     Locomotion Ambulation   Ambulation assist   Ambulation activity did not occur: Safety/medical concerns  Assist level: 2 helpers Assistive device: Walker-rolling Max distance: 5 ft   Walk 10 feet activity   Assist  Walk 10 feet activity did not occur: Safety/medical concerns  Assist level: Total Assistance -  Patient < 25% Assistive device: Walker-rolling, Maxi Sky   Walk 50 feet activity   Assist Walk 50 feet with 2 turns activity did not occur: Safety/medical concerns         Walk 150 feet activity   Assist Walk 150 feet activity did not occur: Safety/medical concerns         Walk 10 feet on uneven surface  activity   Assist Walk 10  feet on uneven surfaces activity did not occur: Safety/medical concerns         Wheelchair     Assist Will patient use wheelchair at discharge?: Yes Type of Wheelchair: Manual Wheelchair activity did not occur: Safety/medical concerns  Wheelchair assist level: Supervision/Verbal cueing Max wheelchair distance: 150'    Wheelchair 50 feet with 2 turns activity    Assist    Wheelchair 50 feet with 2 turns activity did not occur: Safety/medical concerns   Assist Level: Supervision/Verbal cueing   Wheelchair 150 feet activity     Assist  Wheelchair 150 feet activity did not occur: Safety/medical concerns   Assist Level: Supervision/Verbal cueing   Blood pressure 115/80, pulse (!) 114, temperature 98.6 F (37 C), temperature source Oral, resp. rate 16, height 5\' 7"  (1.702 m), weight 93.6 kg, SpO2 97 %.  Medical Problem List and Plan: 1. Impaired mobility and ADLs secondary to COVID-19  --Continue CIR therapies including PT, OT     -making steady progress toward goals  2. Antithrombotics: -DVT/anticoagulation:Pharmaceutical:Lovenox -antiplatelet therapy: N/A 3.Chronic LBP/Pain Management:Oxycodone 5mg  prn. Apply heating pads 3 times per day PRN for low back pain. Continue lidocaine patch. Was on Oxycodone 15mg  PRN PTA.   11/27--continued back and leg pain. Will increase oxycodone to 5-10mg  q4 prn   -tylenol prn   -leg pain doesn't appear to be radicular 4. Mood:LCSW to follow for evaluation and support. Team to provide ego support. -antipsychotic agents: N/A 5. Neuropsych: This patientiscapable of making decisions on herown behalf. 6. Skin/Wound Care:Routine pressure relief measures.  -pinpoint anal lesion: continue weight shifting, pressure relief as needed 7. Fluids/Electrolytes/Nutrition:encourage PO  -mild hypokalemia-continue supplement.  -11/23: hypokalemia resolved. 4.0. potassium supp dc'ed   -continue protein  supplements for low albumin  -recheck labs Monday 8. COPD: Has been on IV solumedrol --->prednisone tapered to off 9. T2DM with neuropathy: Continue Lantus 30 units daily.   -was on metformin xr at home pta (will not resume)  -began amaryl 1mg  daily on 11/20 with improvement  -off steroids  -continue SSI for coverage  -11/27: cbg's improved 10. H/o depression: Monitor for now--not on any meds at this time. Seems very motivated 11. HTN: Monitor BP tid--currently on metoprolol 25mg  BID and controlled 11/24 12. OSA: CPAP at nights with 2L oxygen bled in.  13. Insomnia: improved   -on trazodone 300mg  at home  - trazodone increased to 100mg  qhs 11/25 with some improvement 14. Anxiety: Xanax 0.125mg  BID PRN. Was on 1mg  BID PTA.  15. HLD: Continue atorvastatin 10mg  HS. 16. Leukocytosis d/t steroids  -steroids tapered off  - 11.3 WBC on 11/23    -check labs monday LOS: 10 days A FACE TO [place] 08/01/2019, 10:06 AM

## 2019-08-01 NOTE — Plan of Care (Signed)
  Problem: RH BOWEL ELIMINATION Goal: RH STG MANAGE BOWEL WITH ASSISTANCE Description: STG Manage Bowel with Min Assistance. Outcome: Progressing   Problem: RH BLADDER ELIMINATION Goal: RH STG MANAGE BLADDER WITH ASSISTANCE Description: STG Manage Bladder With Min Assistance Outcome: Progressing   Problem: RH SKIN INTEGRITY Goal: RH STG SKIN FREE OF INFECTION/BREAKDOWN Description: No new breakdown with min assist  Outcome: Progressing Goal: RH STG ABLE TO PERFORM INCISION/WOUND CARE W/ASSISTANCE Description: STG Able To Perform Incision/Wound Care With Mod Assistance. Outcome: Progressing   Problem: RH SAFETY Goal: RH STG ADHERE TO SAFETY PRECAUTIONS W/ASSISTANCE/DEVICE Description: STG Adhere to Safety Precautions With min Assistance/Device. Outcome: Progressing   Problem: RH PAIN MANAGEMENT Goal: RH STG PAIN MANAGED AT OR BELOW PT'S PAIN GOAL Description: <3 out of 10.  Outcome: Progressing

## 2019-08-01 NOTE — Progress Notes (Signed)
Occupational Therapy Session Note  Patient Details  Name: Vanessa Mack MRN: [id number] Date of Birth: March 05, 1951  Today's Date: 08/01/2019  Session 1 OT Individual Time: 1540-0867 OT Individual Time Calculation (min): 57 min   Session 2 OT Individual Time: 1303-1400 OT Individual Time Calculation (min): 57 min    Short Term Goals: Week 2:  OT Short Term Goal 1 (Week 2): Pt will maintain standing for 3 minutes at the sink in preparation for BADL task. OT Short Term Goal 2 (Week 2): Pt will maintain standing while pulling up pants with min A for balance OT Short Term Goal 3 (Week 2): Pt will thread B LEs into pants using AE as needed  Skilled Therapeutic Interventions/Progress Updates:  Session 1   Pt greeted semi-reclined in bed and agreeable to OT treatment session. Pt completed bed mobility w/ HOB elevated and min A. Squat-pivot transfer to drop arm wc with mod A. Nursing administered meds and OT stood with pt at the sink w/ min A for nurse to check skin on bottom. Pt completed stand-pivot into shower using grab bars and mod A. Pt able to bring B UEs to head today to help wash hair. Pt unable to stand in shower long enough to wash bottom, so used lateral leans and  OT assist to reach buttocks. Mod A to don pants with mod A to stand at the sink to pull them up.  B UE strengthening/endurance with hair drying task. Pt able to dry 50% of hair today. OT braided pt's hair and pt left seated in wc with needs met.   Session 2 Pt greeted seated in wc and agreeable to OT treatment session. Pt propelled wc to nurses station for UB strengthening. Continued working on [employer] strength/endurance with 5 mins x 2 on [employer] arm bike. Pt needed 1 extra rest break after 3 mins 2/2 fatgigue. Pt completed stand-pivot to therapy mat with RW and mod A to stand and min A to pivot. UB there-ex and core strengthening with ball twist and toss x3. Standing toe taps on small cones 10x3 with Min/mod A for balance  pending fatigue. OT provided therapeutic massage to neck and back during rest breaks for pain management. Pt completed stand-pivot back to wc with mod A to stand and min A to pivot w/ RW. OT returned pt to room in wc for time management. Pt left seated in wc with heating pad placed on back, call bell in reach, and needs met.   Therapy Documentation Precautions:  Precautions Precautions: Fall, Other (comment) Precaution Comments: monitor HR, SpO2 Restrictions Weight Bearing Restrictions: No  Pain: Pain Assessment Pain Scale: 0-10 Pain Score: 8  Pain Type: Chronic pain Pain Location: Back Pain Orientation: Lower Pain Descriptors / Indicators: Aching Pain Onset: On-going Pain Intervention(s): Repositioned    Therapy/Group: Individual Therapy  Valma Cava 08/01/2019, 2:00 PM

## 2019-08-01 NOTE — Progress Notes (Signed)
Physical Therapy Session Note  Patient Details  Name: Vanessa Mack MRN: [id number] Date of Birth: [birth year]  Today's Date: 08/01/2019 PT Individual Time: 1112-1206 PT Individual Time Calculation (min): 54 min   Short Term Goals: Week 2:  PT Short Term Goal 1 (Week 2): Pt will ambulate 25 ft with LRAD & min assist +1. PT Short Term Goal 2 (Week 2): Pt will complete bed<>w/c via stand pivot with min assist +1. PT Short Term Goal 3 (Week 2): Pt will complete bed mobility with bed flat, without rails with min assist.  Skilled Therapeutic Interventions/Progress Updates: Pt presented in w/c agreeable to therapy. Pt propelled to nsg station supervision for endurance. Pt transported remaining distance to energy conservation. Pt participated in multiple STS at parallel bars with majority of them requiring minA but then requiring modA with increased fatigue. Pt performed all transfers with 1 hand on w/c and with PTA providing verbal cues for increased anterior lean "nose over toes". With each stand pt was able to perform varying tasks including standing march, hip abd/add, hamstring pulls and mini squats x 10-12 bilaterally. Pt also performed gait in parallel bars 2 bouts of 66f x 2 and one bout of 822fx 4. Pt participated in standing tolerance activity of peg board activity in standing with total time approx 4 min before requiring seated rest. HR checked throughout session with max HR 130 SpO2 maintained >94% with activity. PTA encouraged seated rest with elevated HR with recovery into 100s within 30 sec. PTA provided continued edu on energy conservation during daily activities. Pt transported to nsg station and pt propelled remaining distance. Pt remained in w/c at end of session with belt alarm on, call bell within reach and needs met.      Therapy Documentation Precautions:  Precautions Precautions: Fall, Other (comment) Precaution Comments: monitor HR, SpO2 Restrictions Weight Bearing  Restrictions: No General:   Vital Signs:  Pain: Pain Assessment Pain Scale: 0-10 Pain Score: 6  Pain Type: Chronic pain Pain Location: Back Pain Orientation: Lower Pain Descriptors / Indicators: Aching Pain Onset: Gradual Pain Intervention(s): Medication (See eMAR)   Therapy/Group: Individual Therapy  Betania Dizon  Luda Charbonneau, PTA  08/01/2019, 12:56 PM

## 2019-08-01 NOTE — Progress Notes (Addendum)
Physical Therapy Session Note  Patient Details  Name: Vanessa Mack MRN: [id number] Date of Birth: March 08, 1951  Today's Date: 08/01/2019 PT Individual Time: [id number] PT Individual Time Calculation (min): 29 min   Short Term Goals: Week 2:  PT Short Term Goal 1 (Week 2): Pt will ambulate 25 ft with LRAD & min assist +1. PT Short Term Goal 2 (Week 2): Pt will complete bed<>w/c via stand pivot with min assist +1. PT Short Term Goal 3 (Week 2): Pt will complete bed mobility with bed flat, without rails with min assist.  Skilled Therapeutic Interventions/Progress Updates:  Pt received in w/c & agreeable to tx. Pt propels w/c room<>dayroom with BUE & BLE for strengthening & cardiopulmonary endurance training with supervision without any rest breaks. Pt transfers sit>stand from w/c with mod assist with ongoing reliance on BUE for transfer and to upright trunk 2/2 weak glutes/hip extensors. Pt transfers stand pivot with RW & min assist for pivot portion. Pt utilized nu-step on level 2 x 10 minutes with BLE only with task focusing on BLE strengthening & endurance training, & pt does not require any rest breaks. Pt transfers sit>stand from nu-step with min assist and ambulates ~5 ft to w/c with RW & min assist. Back in room pt performs hand hygiene at sink from w/c level with set up assist. At end of session pt left in w/c in room with chair alarm donned & all needs in reach.   Therapy Documentation Precautions:  Precautions Precautions: Fall, Other (comment) Precaution Comments: monitor HR, SpO2 Restrictions Weight Bearing Restrictions: No  Pain: Pt reports 8/10 pain in back & B knees (chronic pain) with RN administering pain meds during session, k-pad applied to low back at beginning & end of sesion.     Therapy/Group: Individual Therapy  Waunita Schooner 08/01/2019, 3:37 PM

## 2019-08-02 ENCOUNTER — Inpatient Hospital Stay (HOSPITAL_COMMUNITY): Payer: Medicare Other | Admitting: Physical Therapy

## 2019-08-02 ENCOUNTER — Inpatient Hospital Stay (HOSPITAL_COMMUNITY): Payer: Medicare Other | Admitting: Occupational Therapy

## 2019-08-02 DIAGNOSIS — R5381 Other malaise: Secondary | ICD-10-CM | POA: Diagnosis not present

## 2019-08-02 LAB — GLUCOSE, CAPILLARY
Glucose-Capillary: 144 mg/dL — ABNORMAL HIGH (ref 70–99)
Glucose-Capillary: 129 mg/dL — ABNORMAL HIGH (ref 70–99)
Glucose-Capillary: 110 mg/dL — ABNORMAL HIGH (ref 70–99)
Glucose-Capillary: 109 mg/dL — ABNORMAL HIGH (ref 70–99)

## 2019-08-02 MED ORDER — TRAZODONE HCL 50 MG PO TABS
300.0000 mg | ORAL_TABLET | Freq: Every day | ORAL | Status: DC
  Administered 2019-08-02 – 2019-08-14 (×13): 300 mg via ORAL
  Filled 2019-08-02 (×13): qty 6

## 2019-08-02 NOTE — Progress Notes (Signed)
Occupational Therapy Session Note  Patient Details  Name: Vanessa Mack MRN: [id number] Date of Birth: Jan 08, 1951  Today's Date: 08/02/2019\ Session 1 OT Individual Time: 1000-1100 OT Individual Time Calculation (min): 60 min   Session 2 OT Individual Time: 1300-1400 OT Individual Time Calculation (min): 60 min    Short Term Goals: Week 2:  OT Short Term Goal 1 (Week 2): Pt will maintain standing for 3 minutes at the sink in preparation for BADL task. OT Short Term Goal 2 (Week 2): Pt will maintain standing while pulling up pants with min A for balance OT Short Term Goal 3 (Week 2): Pt will thread B LEs into pants using AE as needed  Skilled Therapeutic Interventions/Progress Updates:  Session 1   Pt greeted seated in wc and agreeable to OT treatment session. Pt completed sit<>stand from wc with mod A to power up, she then ambulated into bathroom w/ RW and min A. Pt turned to sit onto tub bench and doffed clothing with min A. Bathing completed from tub bench with assistance to wash buttocks with leaning method. Attempted sit<>stand in shower with mod A and grab bars, but pt unable to stand long enough to safely reach peri-area and buttocks. Stand-pivot out of shower with grab bars and min A. Dressing completed wc at the sink with set-up A for UB dressing and min A LB dressing. B UE strengthening with hair drying task seated in wc. Pt able to dry 70% of hair today before fatiguing. OT braided pt's hair and pt left seated in wc with call bell in reach and needs met.  Session 2 Pt greeted sitting in wc after eating lunch and agreeable to OT treatment session. Pt propelled wc to the therapy gym with 2 rest breaks. Pt then ambulated 15 feet in gym w/ RW and min A. Worked on standing balance/endurance and UB strength with standing horse shoe activity. Had pt place horse shoes on basketball goal rim. Incorporated trunk rotation with placing horse shoes to the side of her on mat after taking  them off of rim. Completed activity 3x on each side. UB there-ex with 2 lb dowl rod, 3 sets of 10-bicep curl, straight arm raises, and chest press. Stand-pivot back to wc with RW and mod A to stand, then min A to pivot. OT pushed pt back to room in wc for time management. Pt left seated in wc with chair alarm on, call bell in reach, and needs met.   Therapy Documentation Precautions:  Precautions Precautions: Fall, Other (comment) Precaution Comments: monitor HR, SpO2 Restrictions Weight Bearing Restrictions: No Pain: Pain Assessment Pain Scale: 0-10 Pain Score: 5  Pain Type: Chronic pain Pain Location: Back Pain Descriptors / Indicators: Aching Pain Onset: On-going Pain Intervention(s): Medication (See eMAR)   Therapy/Group: Individual Therapy  Valma Cava 08/02/2019, 2:04 PM

## 2019-08-02 NOTE — Progress Notes (Signed)
[facility] PHYSICAL MEDICINE & REHABILITATION PROGRESS NOTE   Subjective/Complaints:    Pt reports on Trazodone 300 mg QHS at home- said they were titrating up Trazodone here- stil not sleeping well at all on current dose- wondering if can go to home dose, which was on for years.  Pain under control now with Dr Charm Barges help.  Overall pretty well except sleep.  ROS: Patient denies fever, rash, sore throat, blurred vision, nausea, vomiting, diarrhea, cough,   chest pain,  headache, or mood change.    Objective:   No results found. No results for input(s): WBC, HGB, HCT, PLT in the last 72 hours. No results for input(s): NA, K, CL, CO2, GLUCOSE, BUN, CREATININE, CALCIUM in the last 72 hours.  Intake/Output Summary (Last 24 hours) at 08/02/2019 1303 Last data filed at 08/02/2019 0809 Gross per 24 hour  Intake 480 ml  Output -  Net 480 ml     Physical Exam: Vital Signs Blood pressure 107/72, pulse (!) 105, temperature 98.2 F (36.8 C), resp. rate 18, height 5\' 7"  (1.702 m), weight 93.6 kg, SpO2 95 %.  Constitutional: No distress . Vital signs reviewed. Sitting up in manual w/c at bedside; waiting to go to PT; NAD HEENT: EOMI, oral membranes moist Neck: supple Cardiovascular: tachy without murmur. No JVD    Respiratory: CTA Bilaterally without wheezes or rales. Normal effort    GI: BS +, non-tender, non-distended   Extremities: No clubbing, cyanosis, or edema. Pulses are 2+ Skin: Clean and intact without signs of breakdown feet remain dry. Neuro: Pt is cognitively appropriate with normal insight, memory, and awareness. Cranial nerves 2-12 are intact. Sensory exam is normal. Reflexes are 1+ in all 4's. Fine motor coordination is intact. No tremors. Motor function is grossly 4/5 UE, 3+/5 HF, 3+/5 KE and 4/5 ADF/PF.--motor exam slow improving.  Musculoskeletal: normal AROM. Mild pain in legs with palpation Psych: very pleasant  Assessment/Plan: 1. Functional deficits  secondary to debility after COVID which require 3+ hours per day of interdisciplinary therapy in a comprehensive inpatient rehab setting.  Physiatrist is providing close team supervision and 24 hour management of active medical problems listed below.  Physiatrist and rehab team continue to assess barriers to discharge/monitor patient progress toward functional and medical goals  Care Tool:  Bathing    Body parts bathed by patient: Right arm, Left arm, Chest, Abdomen, Face   Body parts bathed by helper: Front perineal area, Buttocks, Right upper leg, Left upper leg, Right lower leg, Left lower leg     Bathing assist Assist Level: Maximal Assistance - Patient 24 - 49%     Upper Body Dressing/Undressing Upper body dressing   What is the patient wearing?: Pull over shirt    Upper body assist Assist Level: Minimal Assistance - Patient > 75%    Lower Body Dressing/Undressing Lower body dressing      What is the patient wearing?: Pants     Lower body assist Assist for lower body dressing: Minimal Assistance - Patient > 75%(standing with Charlaine Dalton)     Toileting Toileting    Toileting assist Assist for toileting: Maximal Assistance - Patient 25 - 49%     Transfers Chair/bed transfer  Transfers assist  Chair/bed transfer activity did not occur: Safety/medical concerns  Chair/bed transfer assist level: Minimal Assistance - Patient > 75%     Locomotion Ambulation   Ambulation assist   Ambulation activity did not occur: Safety/medical concerns  Assist level: Minimal Assistance - Patient >  75% Assistive device: Walker-rolling Max distance: 30   Walk 10 feet activity   Assist  Walk 10 feet activity did not occur: Safety/medical concerns  Assist level: Minimal Assistance - Patient > 75% Assistive device: Walker-rolling   Walk 50 feet activity   Assist Walk 50 feet with 2 turns activity did not occur: Safety/medical concerns         Walk 150 feet  activity   Assist Walk 150 feet activity did not occur: Safety/medical concerns         Walk 10 feet on uneven surface  activity   Assist Walk 10 feet on uneven surfaces activity did not occur: Safety/medical concerns         Wheelchair     Assist Will patient use wheelchair at discharge?: Yes Type of Wheelchair: Manual Wheelchair activity did not occur: Safety/medical concerns  Wheelchair assist level: Supervision/Verbal cueing Max wheelchair distance: 200    Wheelchair 50 feet with 2 turns activity    Assist    Wheelchair 50 feet with 2 turns activity did not occur: Safety/medical concerns   Assist Level: Supervision/Verbal cueing   Wheelchair 150 feet activity     Assist  Wheelchair 150 feet activity did not occur: Safety/medical concerns   Assist Level: Supervision/Verbal cueing   Blood pressure 107/72, pulse (!) 105, temperature 98.2 F (36.8 C), resp. rate 18, height 5\' 7"  (1.702 m), weight 93.6 kg, SpO2 95 %.  Medical Problem List and Plan: 1. Impaired mobility and ADLs secondary to COVID-19  --Continue CIR therapies including PT, OT     -making steady progress toward goals  2. Antithrombotics: -DVT/anticoagulation:Pharmaceutical:Lovenox -antiplatelet therapy: N/A 3.Chronic LBP/Pain Management:Oxycodone 5mg  prn. Apply heating pads 3 times per day PRN for low back pain. Continue lidocaine patch. Was on Oxycodone 15mg  PRN PTA.   11/27--continued back and leg pain. Will increase oxycodone to 5-10mg  q4 prn   -tylenol prn   -leg pain doesn't appear to be radicular 4. Mood:LCSW to follow for evaluation and support. Team to provide ego support. -antipsychotic agents: N/A 5. Neuropsych: This patientiscapable of making decisions on herown behalf. 6. Skin/Wound Care:Routine pressure relief measures.  -pinpoint anal lesion: continue weight shifting, pressure relief as needed 7.  Fluids/Electrolytes/Nutrition:encourage PO  -mild hypokalemia-continue supplement.  -11/23: hypokalemia resolved. 4.0. potassium supp dc'ed   -continue protein supplements for low albumin  -recheck labs Monday 8. COPD: Has been on IV solumedrol --->prednisone tapered to off 9. T2DM with neuropathy: Continue Lantus 30 units daily.   -was on metformin xr at home pta (will not resume)  -began amaryl 1mg  daily on 11/20 with improvement  -off steroids  -continue SSI for coverage  -11/27: cbg's improved 10. H/o depression: Monitor for now--not on any meds at this time. Seems very motivated 11. HTN: Monitor BP tid--currently on metoprolol 25mg  BID and controlled 11/24 12. OSA: CPAP at nights with 2L oxygen bled in.  13. Insomnia: improved   -on trazodone 300mg  at home  - trazodone increased to 100mg  qhs 11/25 with some improvement  -increased trazodone to home dose- was on for years, per pt.  14. Anxiety: Xanax 0.125mg  BID PRN. Was on 1mg  BID PTA.  15. HLD: Continue atorvastatin 10mg  HS. 16. Leukocytosis d/t steroids  -steroids tapered off  - 11.3 WBC on 11/23    -check labs monday LOS: 11 days A FACE TO FACE EVALUATION WAS PERFORMED  Oleta Gunnoe 08/02/2019, 1:03 PM

## 2019-08-02 NOTE — Plan of Care (Signed)
  Problem: RH BOWEL ELIMINATION Goal: RH STG MANAGE BOWEL WITH ASSISTANCE Description: STG Manage Bowel with Min Assistance. Outcome: Progressing   Problem: RH BLADDER ELIMINATION Goal: RH STG MANAGE BLADDER WITH ASSISTANCE Description: STG Manage Bladder With Min Assistance Outcome: Progressing   Problem: RH SKIN INTEGRITY Goal: RH STG SKIN FREE OF INFECTION/BREAKDOWN Description: No new breakdown with min assist  Outcome: Progressing Goal: RH STG ABLE TO PERFORM INCISION/WOUND CARE W/ASSISTANCE Description: STG Able To Perform Incision/Wound Care With Mod Assistance. Outcome: Progressing   Problem: RH SAFETY Goal: RH STG ADHERE TO SAFETY PRECAUTIONS W/ASSISTANCE/DEVICE Description: STG Adhere to Safety Precautions With min Assistance/Device. Outcome: Progressing   Problem: RH PAIN MANAGEMENT Goal: RH STG PAIN MANAGED AT OR BELOW PT'S PAIN GOAL Description: <3 out of 10.  Outcome: Progressing

## 2019-08-02 NOTE — Progress Notes (Signed)
Physical Therapy Session Note  Patient Details  Name: Vanessa Mack MRN: [id number] Date of Birth: 12-May-1951  Today's Date: 08/02/2019 PT Individual Time: 0902-1000 PT Individual Time Calculation (min): 58 min   Short Term Goals: Week 2:  PT Short Term Goal 1 (Week 2): Pt will ambulate 25 ft with LRAD & min assist +1. PT Short Term Goal 2 (Week 2): Pt will complete bed<>w/c via stand pivot with min assist +1. PT Short Term Goal 3 (Week 2): Pt will complete bed mobility with bed flat, without rails with min assist.  Skilled Therapeutic Interventions/Progress Updates:   Pt received sitting in WC and agreeable to PT. WC mobility through hall in rehab x 22f + 1575fwith BUE and BLE. Min cues for improved use of momentum and doorway management for safety.   Sit<>stand [place]. Reciprocal foot taps on 2 inch step 2 x5 BLE. Min assist from PT for safety. No evidence of knee instability with SLS. 5xSTS with 1 UE pushing form PT.  Hip abduction with BUE support on parallel bars, 2 x 5 BLE . Forward/backward gait in parallel bars 2 x 63f30fach. Cues for posture, terminal knee extension and step width to improve gluteal activation and safety.   Gait training with RW 2x30 min assist from PT for safety. No knee instability noted, but increased foot slap bil with fatigue.   Patient returned to room and [facility] and all needs met.          Therapy Documentation Precautions:  Precautions Precautions: Fall, Other (comment) Precaution Comments: monitor HR, SpO2 Restrictions Weight Bearing Restrictions: No Pain: Pain Assessment Pain Scale: 0-10 Pain Score: 4 (pt reports baseline) Pain Type: Chronic pain Pain Location: Back Pain Orientation: Lower Pain Descriptors / Indicators: Aching Pain Onset: On-going Pain Intervention(s): Rest    Therapy/Group: Individual Therapy  AusLorie Phenix/28/2020, 10:03 AM

## 2019-08-03 DIAGNOSIS — R5381 Other malaise: Secondary | ICD-10-CM | POA: Diagnosis not present

## 2019-08-03 LAB — GLUCOSE, CAPILLARY
Glucose-Capillary: 169 mg/dL — ABNORMAL HIGH (ref 70–99)
Glucose-Capillary: 100 mg/dL — ABNORMAL HIGH (ref 70–99)
Glucose-Capillary: 68 mg/dL — ABNORMAL LOW (ref 70–99)
Glucose-Capillary: 97 mg/dL (ref 70–99)
Glucose-Capillary: 135 mg/dL — ABNORMAL HIGH (ref 70–99)

## 2019-08-03 NOTE — Plan of Care (Signed)
  Problem: RH BOWEL ELIMINATION Goal: RH STG MANAGE BOWEL WITH ASSISTANCE Description: STG Manage Bowel with Min Assistance. Outcome: Progressing   Problem: RH BLADDER ELIMINATION Goal: RH STG MANAGE BLADDER WITH ASSISTANCE Description: STG Manage Bladder With Min Assistance Outcome: Progressing   Problem: RH SKIN INTEGRITY Goal: RH STG SKIN FREE OF INFECTION/BREAKDOWN Description: No new breakdown with min assist  Outcome: Progressing Goal: RH STG ABLE TO PERFORM INCISION/WOUND CARE W/ASSISTANCE Description: STG Able To Perform Incision/Wound Care With Mod Assistance. Outcome: Progressing   Problem: RH SAFETY Goal: RH STG ADHERE TO SAFETY PRECAUTIONS W/ASSISTANCE/DEVICE Description: STG Adhere to Safety Precautions With min Assistance/Device. Outcome: Progressing   Problem: RH PAIN MANAGEMENT Goal: RH STG PAIN MANAGED AT OR BELOW PT'S PAIN GOAL Description: <3 out of 10.  Outcome: Progressing

## 2019-08-03 NOTE — Progress Notes (Signed)
Uneventful night. Scheduled trazodone 300mg 's given. This morning c/o left shoulder pain, prn Oxy ir 10mg 's given, per patient's request. Cornell Barman

## 2019-08-03 NOTE — Progress Notes (Signed)
Hypoglycemic Event  CBG: 68   Treatment: 8 oz juice/soda  Symptoms: Nervous/irritable  Follow-up CBG: Time:1210 CBG Result:100  Possible Reasons for Event: Unknown  Comments/MD notified:Patient stable, eating lunch.    [place]

## 2019-08-03 NOTE — Progress Notes (Signed)
[place] PHYSICAL MEDICINE & REHABILITATION PROGRESS NOTE   Subjective/Complaints:    Pt reports slept the best she's slept since coming into the hospital last night with 300 mg Trazodone- was so excited to sleep and feels so much better this AM- says she even feels like needs less pain meds since feeling so good.   ROS: Patient denies fever, rash, sore throat, blurred vision, nausea, vomiting, diarrhea, cough,   chest pain,  headache, or mood change.    Objective:   No results found. No results for input(s): WBC, HGB, HCT, PLT in the last 72 hours. No results for input(s): NA, K, CL, CO2, GLUCOSE, BUN, CREATININE, CALCIUM in the last 72 hours.  Intake/Output Summary (Last 24 hours) at 08/03/2019 1153 Last data filed at 08/03/2019 0753 Gross per 24 hour  Intake 902 ml  Output -  Net 902 ml     Physical Exam: Vital Signs Blood pressure 109/73, pulse (!) 109, temperature 98.2 F (36.8 C), temperature source Oral, resp. rate 18, height 5\' 7"  (1.702 m), weight 93.6 kg, SpO2 93 %.  Constitutional: No distress . Vital signs reviewed. Sitting up in manual w/c at bedside;  NAD HEENT: EOMI, oral membranes moist Neck: supple Cardiovascular: tachy still without murmur. No JVD    Respiratory: CTA Bilaterally without wheezes or rales. Normal effort    GI: BS +, non-tender, non-distended   Extremities: No clubbing, cyanosis, or edema. Pulses are 2+ Skin: Clean and intact without signs of breakdown feet remain dry. Neuro: Pt is cognitively appropriate with normal insight, memory, and awareness. Cranial nerves 2-12 are intact. Sensory exam is normal. Reflexes are 1+ in all 4's. Fine motor coordination is intact. No tremors. Motor function is grossly 4/5 UE, 3+/5 HF, 3+/5 KE and 4/5 ADF/PF.--motor exam slow improving.  Musculoskeletal: normal AROM. Mild pain in legs with palpation Psych: very pleasant- bright affect  Assessment/Plan: 1. Functional deficits secondary to debility after  COVID which require 3+ hours per day of interdisciplinary therapy in a comprehensive inpatient rehab setting.  Physiatrist is providing close team supervision and 24 hour management of active medical problems listed below.  Physiatrist and rehab team continue to assess barriers to discharge/monitor patient progress toward functional and medical goals  Care Tool:  Bathing    Body parts bathed by patient: Right arm, Left arm, Chest, Abdomen, Face   Body parts bathed by helper: Front perineal area, Buttocks, Right upper leg, Left upper leg, Right lower leg, Left lower leg     Bathing assist Assist Level: Maximal Assistance - Patient 24 - 49%     Upper Body Dressing/Undressing Upper body dressing   What is the patient wearing?: Pull over shirt    Upper body assist Assist Level: Minimal Assistance - Patient > 75%    Lower Body Dressing/Undressing Lower body dressing      What is the patient wearing?: Pants     Lower body assist Assist for lower body dressing: Minimal Assistance - Patient > 75%(standing with Charlaine Dalton)     Toileting Toileting    Toileting assist Assist for toileting: Maximal Assistance - Patient 25 - 49%     Transfers Chair/bed transfer  Transfers assist  Chair/bed transfer activity did not occur: Safety/medical concerns  Chair/bed transfer assist level: Minimal Assistance - Patient > 75%     Locomotion Ambulation   Ambulation assist   Ambulation activity did not occur: Safety/medical concerns  Assist level: Minimal Assistance - Patient > 75% Assistive device: Walker-rolling Max distance:  30   Walk 10 feet activity   Assist  Walk 10 feet activity did not occur: Safety/medical concerns  Assist level: Minimal Assistance - Patient > 75% Assistive device: Walker-rolling   Walk 50 feet activity   Assist Walk 50 feet with 2 turns activity did not occur: Safety/medical concerns         Walk 150 feet activity   Assist Walk 150 feet  activity did not occur: Safety/medical concerns         Walk 10 feet on uneven surface  activity   Assist Walk 10 feet on uneven surfaces activity did not occur: Safety/medical concerns         Wheelchair     Assist Will patient use wheelchair at discharge?: Yes Type of Wheelchair: Manual Wheelchair activity did not occur: Safety/medical concerns  Wheelchair assist level: Supervision/Verbal cueing Max wheelchair distance: 200    Wheelchair 50 feet with 2 turns activity    Assist    Wheelchair 50 feet with 2 turns activity did not occur: Safety/medical concerns   Assist Level: Supervision/Verbal cueing   Wheelchair 150 feet activity     Assist  Wheelchair 150 feet activity did not occur: Safety/medical concerns   Assist Level: Supervision/Verbal cueing   Blood pressure 109/73, pulse (!) 109, temperature 98.2 F (36.8 C), temperature source Oral, resp. rate 18, height 5\' 7"  (1.702 m), weight 93.6 kg, SpO2 93 %.  Medical Problem List and Plan: 1. Impaired mobility and ADLs secondary to COVID-19  --Continue CIR therapies including PT, OT     -making steady progress toward goals  2. Antithrombotics: -DVT/anticoagulation:Pharmaceutical:Lovenox -antiplatelet therapy: N/A 3.Chronic LBP/Pain Management:Oxycodone 5mg  prn. Apply heating pads 3 times per day PRN for low back pain. Continue lidocaine patch. Was on Oxycodone 15mg  PRN PTA.   11/27--continued back and leg pain. Will increase oxycodone to 5-10mg  q4 prn   -tylenol prn   -leg pain doesn't appear to be radicular 4. Mood:LCSW to follow for evaluation and support. Team to provide ego support. -antipsychotic agents: N/A 5. Neuropsych: This patientiscapable of making decisions on herown behalf. 6. Skin/Wound Care:Routine pressure relief measures.  -pinpoint anal lesion: continue weight shifting, pressure relief as needed 7. Fluids/Electrolytes/Nutrition:encourage  PO  -mild hypokalemia-continue supplement.  -11/23: hypokalemia resolved. 4.0. potassium supp dc'ed   -continue protein supplements for low albumin  -recheck labs Monday 8. COPD: Has been on IV solumedrol --->prednisone tapered to off 9. T2DM with neuropathy: Continue Lantus 30 units daily.   -was on metformin xr at home pta (will not resume)  -began amaryl 1mg  daily on 11/20 with improvement  -off steroids  -continue SSI for coverage  -11/27: cbg's improved 10. H/o depression: Monitor for now--not on any meds at this time. Seems very motivated 11. HTN: Monitor BP tid--currently on metoprolol 25mg  BID and controlled 11/24 12. OSA: CPAP at nights with 2L oxygen bled in.  13. Insomnia: improved   -on trazodone 300mg  at home  - trazodone increased to 100mg  qhs 11/25 with some improvement  -increased trazodone to home dose- was on for years, per pt.   -did great last night with home trazodone dose.  14. Anxiety: Xanax 0.125mg  BID PRN. Was on 1mg  BID PTA.  15. HLD: Continue atorvastatin 10mg  HS. 16. Leukocytosis d/t steroids  -steroids tapered off  - 11.3 WBC on 11/23    -check labs monday LOS: 12 days A FACE TO FACE EVALUATION WAS PERFORMED  Khizar Fiorella 08/03/2019, 11:53 AM

## 2019-08-03 NOTE — Progress Notes (Signed)
Wound to the anal area was measured. No surrounding redness noted or other signs of infection. Skin barrier applied. The stage 2 to the coccyx is healed. Skin is pink & blanchable. No signs of discomfort, patient is able to turn self. She is continent. Will continue to monitor. She c/o a lump to her mid chest, right at the xiphoid. There is a lump there. No c/o pain from it.C/o pain to her left shoulder. Her nurse was made aware & given report of findings.

## 2019-08-04 ENCOUNTER — Inpatient Hospital Stay (HOSPITAL_COMMUNITY): Payer: Medicare Other

## 2019-08-04 ENCOUNTER — Inpatient Hospital Stay (HOSPITAL_COMMUNITY): Payer: Medicare Other | Admitting: Occupational Therapy

## 2019-08-04 ENCOUNTER — Encounter (HOSPITAL_COMMUNITY): Payer: Self-pay | Admitting: *Deleted

## 2019-08-04 DIAGNOSIS — R5381 Other malaise: Secondary | ICD-10-CM | POA: Diagnosis not present

## 2019-08-04 DIAGNOSIS — G479 Sleep disorder, unspecified: Secondary | ICD-10-CM | POA: Diagnosis not present

## 2019-08-04 DIAGNOSIS — E119 Type 2 diabetes mellitus without complications: Secondary | ICD-10-CM | POA: Diagnosis not present

## 2019-08-04 DIAGNOSIS — E876 Hypokalemia: Secondary | ICD-10-CM | POA: Diagnosis not present

## 2019-08-04 LAB — CBC
HCT: 38.9 % (ref 36.0–46.0)
Hemoglobin: 12 g/dL (ref 12.0–15.0)
MCH: 29.3 pg (ref 26.0–34.0)
MCHC: 30.8 g/dL (ref 30.0–36.0)
MCV: 94.9 fL (ref 80.0–100.0)
Platelets: 179 10*3/uL (ref 150–400)
RBC: 4.1 MIL/uL (ref 3.87–5.11)
RDW: 15.5 % (ref 11.5–15.5)
WBC: 9.2 10*3/uL (ref 4.0–10.5)
nRBC: 0 % (ref 0.0–0.2)

## 2019-08-04 LAB — BASIC METABOLIC PANEL
Anion gap: 12 (ref 5–15)
BUN: 9 mg/dL (ref 8–23)
CO2: 26 mmol/L (ref 22–32)
Calcium: 8.7 mg/dL — ABNORMAL LOW (ref 8.9–10.3)
Chloride: 105 mmol/L (ref 98–111)
Creatinine, Ser: 0.62 mg/dL (ref 0.44–1.00)
GFR calc Af Amer: >60 mL/min (ref >60)
GFR calc non Af Amer: >60 mL/min (ref >60)
Glucose, Bld: 174 mg/dL — ABNORMAL HIGH (ref 70–99)
Potassium: 3.1 mmol/L — ABNORMAL LOW (ref 3.5–5.1)
Sodium: 143 mmol/L (ref 135–145)

## 2019-08-04 LAB — GLUCOSE, CAPILLARY
Glucose-Capillary: 129 mg/dL — ABNORMAL HIGH (ref 70–99)
Glucose-Capillary: 97 mg/dL (ref 70–99)
Glucose-Capillary: 80 mg/dL (ref 70–99)
Glucose-Capillary: 112 mg/dL — ABNORMAL HIGH (ref 70–99)

## 2019-08-04 MED ORDER — POTASSIUM CHLORIDE CRYS ER 20 MEQ PO TBCR
20.0000 meq | EXTENDED_RELEASE_TABLET | Freq: Two times a day (BID) | ORAL | Status: DC
  Administered 2019-08-04 – 2019-08-15 (×23): 20 meq via ORAL
  Filled 2019-08-04 (×24): qty 1

## 2019-08-04 NOTE — Progress Notes (Signed)
Physical Therapy Session Note  Patient Details  Name: RAYEN PALEN MRN: [id number] Date of Birth: 11/03/1950  Today's Date: 08/04/2019 PT Individual Time: 1510-1605 PT Individual Time Calculation (min): 55 min   Short Term Goals:  Week 2:  PT Short Term Goal 1 (Week 2): Pt will ambulate 25 ft with LRAD & min assist +1. PT Short Term Goal 1 - Progress (Week 2): Met PT Short Term Goal 2 (Week 2): Pt will complete bed<>w/c via stand pivot with min assist +1. PT Short Term Goal 2 - Progress (Week 2): Met PT Short Term Goal 3 (Week 2): Pt will complete bed mobility with bed flat, without rails with min assist. PT Short Term Goal 3 - Progress (Week 2): Progressing toward goal  Skilled Therapeutic Interventions/Progress Updates:    Pt seated in wc.  She stated that she has not has as much energy today, and is frustrated.  PT offered emotional support.  neuromuscular re-education via forced use, multimodal cues using Kinetron from w/c level, targeting quadriceps at 50 cm/sec x 30 cycles, targeting gluteals at 40 cm/sec x 30 cycles x 2. In sitting, use of 3# weighted bar for D1 PNF pattern x 10 R/L.  In standing with bil UE support, 10 x 1 mini squats; R/L hip abduction while standing on 1" high board with contralateral foot.  Sit> stand in parallel bars, L hand on bar, R hand on w/c armrest, CGA.  Gait x 8' forward/backward with bil UE support, R knee buckled x 1; no LOB. 2nd and 3rd bouts of sit> stand iwht mod assist, pushing up with bil hands on w/c.  Transfer training for wt shifting forward, arms across chest, x 10.  W/c propulsion using bil UEs x 100' including turns, with supervision.  At end of session, pt in w/c with needs at hand.    Therapy Documentation Precautions:  Precautions Precautions: Fall, Other (comment) Precaution Comments: monitor HR, SpO2 Restrictions Weight Bearing Restrictions: No Therapy Vitals Temp: 98.4 F (36.9 C) Pulse Rate: (!) 111 Resp: 18 BP:  108/68 Patient Position (if appropriate): Sitting Oxygen Therapy SpO2: 91 % O2 Device: Room Air Pain:4/10 chronic back, bil knee, neck pain; premedicated and using K pad on back when in room.       Therapy/Group: Individual Therapy  Auren Valdes 08/04/2019, 4:25 PM

## 2019-08-04 NOTE — Progress Notes (Signed)
Slept good. Intermittent complaint of pain to back. Pain managed with PRN Oxy IR 10mg 's, given at 2021 and 0529. Requests bedpan during night, but gets up with the stedy all other times. Vanessa Mack A

## 2019-08-04 NOTE — Progress Notes (Signed)
Occupational Therapy Session Note  Patient Details  Name: Vanessa Mack MRN: [id number] Date of Birth: July 03, 1951  Today's Date: 08/04/2019 OT Individual Time: [id number] and 1103-1200 OT Individual Time Calculation (min): 74 min and 57 min  Short Term Goals: Week 2:  OT Short Term Goal 1 (Week 2): Pt will maintain standing for 3 minutes at the sink in preparation for BADL task. OT Short Term Goal 2 (Week 2): Pt will maintain standing while pulling up pants with min A for balance OT Short Term Goal 3 (Week 2): Pt will thread B LEs into pants using AE as needed  Skilled Therapeutic Interventions/Progress Updates:    Pt greeted in the bathroom, transferring off of toilet using Stedy with NT. She wanted to shower today. After pt transferred to the w/c, ambulatory TTB transfer completed using RW with steady assist and Mod A for power up. Pt then bathed while seated with close supervision for dynamic sitting balance. Note increased time and exertion when shampooing, conditioning, and rinsing her long hair. Mod A for thoroughly washing peri-areas and reaching feet. Pt then ambulated to w/c positioned at sink using RW once again, Min A for power up from TTB! While sitting at the sink pt brushed and blow-dried hair to work on general UB strength and endurance. Increased time required for pt to meet task demands unassisted. We then applied lotion to UB/LB body due to psoriasis and dry skin. Mod A for sit<stand at sink to apply brief. Pt donned hospital gown with setup. She remained in w/c at end of session, agreeable to wait until next session with this therapist for dressing. Left her with all needs within reach.   2nd Session 1:1 tx(57 min) Pt greeted in w/c, ready to get dressed. She did so sit<stand at the sink, close supervision when powering up to pull up her pants! Min vcs for using using reacher, sock aide, and shoe horn to doff/don gripper socks and don shoes. She states she already has reacher,  sock aide, and shoe horn at home, though her shoe horn is a little shorter than the one she used today. Afterwards continued working on sit<stands and standing balance during bedmaking tasks. Mod A for multiple sit<stands at elevated bed. Pt able to side-step around bed with steady assist to apply linens and adjust them for evenness. She also stood to don the pillowcases and folded extra cases. At end of session pt ambulated from bed to her door and then back to w/c, using RW with steady assist. Pt expressed feeling very proud and accomplished during her OT sessions today. We celebrated! At end of session pt remained in the w/c with all needs within reach and k-pad for back pain mgt.     Therapy Documentation Precautions:  Precautions Precautions: Fall, Other (comment) Precaution Comments: monitor HR, SpO2 Restrictions Weight Bearing Restrictions: No Pain: Back pain. Placed k-pad in pts w/c at end of both sessions for relief    ADL: ADL Equipment Provided: Leg straps Grooming: Minimal assistance Upper Body Bathing: Minimal assistance Lower Body Bathing: Maximal assistance Where Assessed-Lower Body Bathing: Edge of bed Upper Body Dressing: Minimal assistance Where Assessed-Upper Body Dressing: Edge of bed Lower Body Dressing: Dependent Where Assessed-Lower Body Dressing: Edge of bed Toileting: Dependent Where Assessed-Toileting: Glass blower/designer: Maximal Print production planner Method: Nutritional therapist: Not assessed      Therapy/Group: Individual Therapy  Linda Biehn A Jamaria Amborn 08/04/2019, 12:38 PM

## 2019-08-04 NOTE — Progress Notes (Signed)
[facility] PHYSICAL MEDICINE & REHABILITATION PROGRESS NOTE   Subjective/Complaints:    Continued improved sleep. Up early with therapy in bathroom this morning. Pain controlled  ROS: Patient denies fever, rash, sore throat, blurred vision, nausea, vomiting, diarrhea, cough, shortness of breath or chest pain,   headache, or mood change.      Objective:   No results found. Recent Labs    08/04/19 0631  WBC 9.2  HGB 12.0  HCT 38.9  PLT 179   Recent Labs    08/04/19 0631  NA 143  K 3.1*  CL 105  CO2 26  GLUCOSE 174*  BUN 9  CREATININE 0.62  CALCIUM 8.7*    Intake/Output Summary (Last 24 hours) at 08/04/2019 1007 Last data filed at 08/04/2019 0840 Gross per 24 hour  Intake 1484 ml  Output -  Net 1484 ml     Physical Exam: Vital Signs Blood pressure 116/89, pulse (!) 112, temperature 98.8 F (37.1 C), temperature source Oral, resp. rate 18, height 5\' 7"  (1.702 m), weight 93.6 kg, SpO2 94 %.  Constitutional: No distress . Vital signs reviewed. HEENT: EOMI, oral membranes moist Neck: supple Cardiovascular: RRR without murmur. No JVD    Respiratory: CTA Bilaterally without wheezes or rales. Normal effort    GI: BS +, non-tender, non-distended   Extremities: No clubbing, cyanosis, or edema. Pulses are 2+ Skin: Clean and intact without signs of breakdown feet remain dry. Neuro: Pt is cognitively appropriate with normal insight, memory, and awareness. Cranial nerves 2-12 are intact. Sensory exam is normal. Reflexes are 1+ in all 4's. Fine motor coordination is intact. No tremors. Motor function is grossly 4/5 UE, 3+/5 HF, 3+/5 KE and 4/5 ADF/PF.--stable to improved motor scores.  Musculoskeletal: normal AROM.  LB,LE pain with movement Psych: pleasant as always   Assessment/Plan: 1. Functional deficits secondary to debility after COVID which require 3+ hours per day of interdisciplinary therapy in a comprehensive inpatient rehab setting.  Physiatrist is  providing close team supervision and 24 hour management of active medical problems listed below.  Physiatrist and rehab team continue to assess barriers to discharge/monitor patient progress toward functional and medical goals  Care Tool:  Bathing    Body parts bathed by patient: Right arm, Left arm, Chest, Abdomen, Face, Right upper leg, Left upper leg   Body parts bathed by helper: Front perineal area, Buttocks, Right lower leg, Left lower leg     Bathing assist Assist Level: Moderate Assistance - Patient 50 - 74%     Upper Body Dressing/Undressing Upper body dressing   What is the patient wearing?: Pull over shirt    Upper body assist Assist Level: Minimal Assistance - Patient > 75%    Lower Body Dressing/Undressing Lower body dressing      What is the patient wearing?: Pants     Lower body assist Assist for lower body dressing: Minimal Assistance - Patient > 75%(standing with Charlaine Dalton)     Toileting Toileting    Toileting assist Assist for toileting: Maximal Assistance - Patient 25 - 49%     Transfers Chair/bed transfer  Transfers assist  Chair/bed transfer activity did not occur: Safety/medical concerns  Chair/bed transfer assist level: Minimal Assistance - Patient > 75%     Locomotion Ambulation   Ambulation assist   Ambulation activity did not occur: Safety/medical concerns  Assist level: Minimal Assistance - Patient > 75% Assistive device: Walker-rolling Max distance: 30   Walk 10 feet activity   Assist  Walk  10 feet activity did not occur: Safety/medical concerns  Assist level: Minimal Assistance - Patient > 75% Assistive device: Walker-rolling   Walk 50 feet activity   Assist Walk 50 feet with 2 turns activity did not occur: Safety/medical concerns         Walk 150 feet activity   Assist Walk 150 feet activity did not occur: Safety/medical concerns         Walk 10 feet on uneven surface  activity   Assist Walk 10 feet on  uneven surfaces activity did not occur: Safety/medical concerns         Wheelchair     Assist Will patient use wheelchair at discharge?: Yes Type of Wheelchair: Manual Wheelchair activity did not occur: Safety/medical concerns  Wheelchair assist level: Supervision/Verbal cueing Max wheelchair distance: 200    Wheelchair 50 feet with 2 turns activity    Assist    Wheelchair 50 feet with 2 turns activity did not occur: Safety/medical concerns   Assist Level: Supervision/Verbal cueing   Wheelchair 150 feet activity     Assist  Wheelchair 150 feet activity did not occur: Safety/medical concerns   Assist Level: Supervision/Verbal cueing   Blood pressure 116/89, pulse (!) 112, temperature 98.8 F (37.1 C), temperature source Oral, resp. rate 18, height 5\' 7"  (1.702 m), weight 93.6 kg, SpO2 94 %.  Medical Problem List and Plan: 1. Impaired mobility and ADLs secondary to COVID-19  --Continue CIR therapies including PT, OT    -team conf tomorrow 2. Antithrombotics: -DVT/anticoagulation:Pharmaceutical:Lovenox -antiplatelet therapy: N/A 3.Chronic LBP/Pain Management:Oxycodone 5mg  prn. Apply heating pads 3 times per day PRN for low back pain. Continue lidocaine patch. Was on Oxycodone 15mg  PRN PTA.   11/27-for lbp/leg pain increased oxycodone to 5-10mg  q4 prn   -tylenol prn     4. Mood:LCSW to follow for evaluation and support. Team to provide ego support. -antipsychotic agents: N/A 5. Neuropsych: This patientiscapable of making decisions on herown behalf. 6. Skin/Wound Care:Routine pressure relief measures.  -pinpoint anal lesion: continue weight shifting, pressure relief as needed 7. Fluids/Electrolytes/Nutrition:encourage PO  -mild hypokalemia-continue supplement.  -persistent hypokalemia (3.0)--resume supplementation---will keep standing   -continue protein supplements for low albumin  -I personally reviewed the patient's  labs today.   8. COPD: Has been on IV solumedrol --->prednisone tapered to off 9. T2DM with neuropathy: Continue Lantus 30 units daily.   -was on metformin xr at home pta (will not resume)  -began amaryl 1mg  daily on 11/20 with improvement  -off steroids  -continue SSI for coverage  -11/30 cbg's acceptable 10. H/o depression: Monitor for now--not on any meds at this time. Seems very motivated 11. HTN: Monitor BP tid--currently on metoprolol 25mg  BID and controlled 11/24 12. OSA: CPAP at nights with 2L oxygen bled in.  13. Insomnia: improved   -trazodone increased to home dose with improvement, 300mg  qhs 14. Anxiety: Xanax 0.125mg  BID PRN. Was on 1mg  BID PTA.  15. HLD: Continue atorvastatin 10mg  HS. 16. Leukocytosis d/t steroids  -steroids tapered off  - wbc's 9.2 11/30  LOS: 13 days A FACE TO [place] 08/04/2019, 10:07 AM

## 2019-08-04 NOTE — Progress Notes (Signed)
Physical Therapy Weekly Progress Note  Patient Details  Name: Vanessa Mack MRN: [id number] Date of Birth: June 12, 1951  Beginning of progress report period: July 29, 2019 End of progress report period: August 04, 2019  Today's Date: 08/04/2019   Patient has met 2 of 3 short term goals.  Pt is making good progress towards all LTG's. Pt has initiated gait training with RW for a max of 30 ft with min assist and can complete sit<>stand transfers with min/mod assist and stand pivot with min assist with RW. Pt is very motivated to participate in therapies and will benefit from ongoing skilled PT treatment to focus on transfers, gait, balance, standing tolerance, BLE strengthening, endurance and d/c planning.   Patient continues to demonstrate the following deficits muscle weakness, decreased cardiorespiratoy endurance, and decreased standing balance, decreased postural control and decreased balance strategies and therefore will continue to benefit from skilled PT intervention to increase functional independence with mobility.  Patient progressing toward long term goals..  Continue plan of care.  PT Short Term Goals Week 2:  PT Short Term Goal 1 (Week 2): Pt will ambulate 25 ft with LRAD & min assist +1. PT Short Term Goal 1 - Progress (Week 2): Met PT Short Term Goal 2 (Week 2): Pt will complete bed<>w/c via stand pivot with min assist +1. PT Short Term Goal 2 - Progress (Week 2): Met PT Short Term Goal 3 (Week 2): Pt will complete bed mobility with bed flat, without rails with min assist. PT Short Term Goal 3 - Progress (Week 2): Progressing toward goal Week 3:  PT Short Term Goal 1 (Week 3): Pt will ambulate 50 ft with LRAD & min assist +1. PT Short Term Goal 2 (Week 3): Pt will negotiate 4 steps with B rails & min assist. PT Short Term Goal 3 (Week 3): Pt will complete car transfer with min assist +1. PT Short Term Goal 4 (Week 3): Pt will complete stand pivot transfers with LRAD &  CGA.    Therapy Documentation Precautions:  Precautions Precautions: Fall, Other (comment) Precaution Comments: monitor HR, SpO2 Restrictions Weight Bearing Restrictions: No  Therapy/Group: Individual Therapy  Waunita Schooner 08/04/2019, 10:57 AM

## 2019-08-05 ENCOUNTER — Inpatient Hospital Stay (HOSPITAL_COMMUNITY): Payer: Medicare Other | Admitting: Physical Therapy

## 2019-08-05 ENCOUNTER — Inpatient Hospital Stay (HOSPITAL_COMMUNITY): Payer: Medicare Other | Admitting: Occupational Therapy

## 2019-08-05 DIAGNOSIS — E119 Type 2 diabetes mellitus without complications: Secondary | ICD-10-CM | POA: Diagnosis not present

## 2019-08-05 DIAGNOSIS — G479 Sleep disorder, unspecified: Secondary | ICD-10-CM | POA: Diagnosis not present

## 2019-08-05 DIAGNOSIS — R5381 Other malaise: Secondary | ICD-10-CM | POA: Diagnosis not present

## 2019-08-05 DIAGNOSIS — E876 Hypokalemia: Secondary | ICD-10-CM | POA: Diagnosis not present

## 2019-08-05 LAB — GLUCOSE, CAPILLARY
Glucose-Capillary: 113 mg/dL — ABNORMAL HIGH (ref 70–99)
Glucose-Capillary: 101 mg/dL — ABNORMAL HIGH (ref 70–99)
Glucose-Capillary: 88 mg/dL (ref 70–99)
Glucose-Capillary: 139 mg/dL — ABNORMAL HIGH (ref 70–99)

## 2019-08-05 MED ORDER — SENNOSIDES-DOCUSATE SODIUM 8.6-50 MG PO TABS
2.0000 | ORAL_TABLET | Freq: Every day | ORAL | Status: DC
  Administered 2019-08-05 – 2019-08-14 (×8): 2 via ORAL
  Filled 2019-08-05 (×9): qty 2

## 2019-08-05 NOTE — Progress Notes (Signed)
Occupational Therapy Weekly Progress Note  Patient Details  Name: Vanessa Mack MRN: [id number] Date of Birth: 10/22/1950  Beginning of progress report period: July 23, 2019 End of progress report period: August 05, 2019  Today's Date: 08/05/2019 OT Individual Time: 7096-2836 OT Individual Time Calculation (min): 71 min    Patient has met 3 of 3 short term goals.  Pt has made great progress towards OT goals this week. She continues to need up to Mod A to stand with a RW, but is becoming more consistent with min A. BADL tasks are progressing well to an overall min A for BADL tasks. Pt continues to be limited by overall endurance and strength deficits that limit her participation in BADL tasks. Continue current POC.  Patient continues to demonstrate the following deficits: muscle weakness, decreased cardiorespiratoy endurance and decreased standing balance, decreased postural control and decreased balance strategies and therefore will continue to benefit from skilled OT intervention to enhance overall performance with BADL.  Patient progressing toward long term goals..  Continue plan of care.  OT Short Term Goals Week 2:  OT Short Term Goal 1 (Week 2): Pt will maintain standing for 3 minutes at the sink in preparation for BADL task. OT Short Term Goal 1 - Progress (Week 2): Met OT Short Term Goal 2 (Week 2): Pt will maintain standing while pulling up pants with min A for balance OT Short Term Goal 2 - Progress (Week 2): Met OT Short Term Goal 3 (Week 2): Pt will thread B LEs into pants using AE as needed OT Short Term Goal 3 - Progress (Week 2): Met Week 3:  OT Short Term Goal 1 (Week 3): Pt will blow dry hair completely with only set-up a OT Short Term Goal 2 (Week 3): Pt will perform sit<>stand from raised commode with consistent Min A OT Short Term Goal 3 (Week 3): Pt will tolerate standing for 5 minutes within BADL tasks.  Skilled Therapeutic Interventions/Progress Updates:    Pt greeted semi-reclined in bed and agreeable to OT treatment session. Pt reported feeling very weak yesterday, but felt better today. Pt completed bed mobility with min A. Sit<>stand from bed with mod A and RW. Pt then ambulated to bathroom w/ CGA. Pt sat on raised BSC over toilet. Pt able to manage clothing with CGA. Pt voided bladder and able to complete peri-care with CGA. Pt then ambulated to the tub bench in shower with CGA. Pt completed bathing with min A to wash feet, leaning method to wash buttocks and peri-area. Pt able to wash hair today with B UEs and 2 rest breaks to rinse out hair. Dressing completed wc at the sink with pt able to thread pants today, Min A to stand and CGA to pull up pants. OT assisted with cutting Toe nails. Pt reports her sister usually helps with this task. Pt then able to don socks using leg rests of wc to bring legs closer. Pt able to comb and dry her hair 80% of to way. PT left seated in wc with chair alarm on, call bell in reach, and needs met.   Therapy Documentation Precautions:  Precautions Precautions: Fall, Other (comment) Precaution Comments: monitor HR, SpO2 Restrictions Weight Bearing Restrictions: No Pain: Pain Assessment Pain Scale: 0-10 Pain Score: 5  Pain Type: Chronic pain Pain Location: Back Pain Orientation: Mid;Lower Pain Descriptors / Indicators: Aching Pain Frequency: Intermittent Pain Onset: On-going Pain Intervention(s): Repositioned   Therapy/Group: Individual Therapy  Valma Cava 08/05/2019, 9:34  AM  

## 2019-08-05 NOTE — Progress Notes (Signed)
Physical Therapy Session Note  Patient Details  Name: Vanessa Mack MRN: [id number] Date of Birth: 12/07/1950  Today's Date: 08/05/2019 PT Individual Time: 1104-1201 PT Individual Time Calculation (min): 57 min   Short Term Goals: Week 3:  PT Short Term Goal 1 (Week 3): Pt will ambulate 50 ft with LRAD & min assist +1. PT Short Term Goal 2 (Week 3): Pt will negotiate 4 steps with B rails & min assist. PT Short Term Goal 3 (Week 3): Pt will complete car transfer with min assist +1. PT Short Term Goal 4 (Week 3): Pt will complete stand pivot transfers with LRAD & CGA.  Skilled Therapeutic Interventions/Progress Updates:  Pt received in w/c & agreeable to tx. Transported pt to dayroom via w/c dependent assist for time management. Pt transfers sit>stand and stand pivot with RW & min assist with improving demo of safe hand placement during transfers. Pt transfers sit>supine with supervision & extra time, using BUE to assist BLE onto mat. Provided pt with support underneath B knees while lying supine as pt reports discomfort with this position that is made better with B knee support. Pt performed the following BLE strengthening exercises with instructional cuing for technique & task focusing on BLE strengthening: short arc quads (2 sets x 15 reps), heel slides (2 sets x 15 reps), and bridging with pt demonstrating slightly increased ability to clear buttocks from mat table. Pt transferred supine>R sidelying>sitting EOM with supervision, extra time, and significant effort from pt. From elevated EOM pt performed multiple sit<>stands with 1UE support and min assist with cuing for increased glute/hamstring activation and task focusing on BLE strengthening. At end of session pt left in w/c in room with chair alarm donned & all needs in reach.  Therapy Documentation Precautions:  Precautions Precautions: Fall, Other (comment) Precaution Comments: monitor HR, SpO2 Restrictions Weight Bearing  Restrictions: No   Therapy/Group: Individual Therapy  Waunita Schooner 08/05/2019, 12:07 PM

## 2019-08-05 NOTE — Progress Notes (Signed)
Physical Therapy Session Note  Patient Details  Name: Vanessa Mack MRN: [id number] Date of Birth: April 20, 1951  Today's Date: 08/05/2019 PT Individual Time: 1450-1530 PT Individual Time Calculation (min): 40 min   Short Term Goals: Week 3:  PT Short Term Goal 1 (Week 3): Pt will ambulate 50 ft with LRAD & min assist +1. PT Short Term Goal 2 (Week 3): Pt will negotiate 4 steps with B rails & min assist. PT Short Term Goal 3 (Week 3): Pt will complete car transfer with min assist +1. PT Short Term Goal 4 (Week 3): Pt will complete stand pivot transfers with LRAD & CGA.  Skilled Therapeutic Interventions/Progress Updates:  Pt received in w/c & agreeable to tx. Pt propels w/c room>bathrooms by nurses station with [place] with supervision for strength & endurance training. Transported pt to ortho gym via w/c dependent assist for time management. Pt completes car transfer at SUV simulated height with min assist with assistance to place LLE into car & pt using UE to assist LE's PRN. Discussed d/c plans with pt reporting she will either d/c to her apartment that's 1 level with a level entry or to her son's house that's a 1 level with 1 step to enter with B rails - educated pt on need to discuss & finalize d/c plans with her son and educated her on recommendation of 24 hr supervision at d/c. Pt performed 2 sets of 5x sit<>stand from w/c with 1UE support and min assist with task focusing on BLE strengthening. At end of session pt left in w/c with chair alarm donned & all needs at hand.  Therapy Documentation Precautions:  Precautions Precautions: Fall, Other (comment) Precaution Comments: monitor HR, SpO2 Restrictions Weight Bearing Restrictions: No  Pain: Pt reports 5/10 chronic pain in back & knees with rest breaks given PRN.   Therapy/Group: Individual Therapy  Waunita Schooner 08/05/2019, 3:36 PM

## 2019-08-05 NOTE — Progress Notes (Signed)
Physical Therapy Session Note  Patient Details  Name: Vanessa Mack MRN: [id number] Date of Birth: 1950-12-22  Today's Date: 08/05/2019 PT Individual Time: 1002-1030 PT Individual Time Calculation (min): 28 min   Short Term Goals: Week 3:  PT Short Term Goal 1 (Week 3): Pt will ambulate 50 ft with LRAD & min assist +1. PT Short Term Goal 2 (Week 3): Pt will negotiate 4 steps with B rails & min assist. PT Short Term Goal 3 (Week 3): Pt will complete car transfer with min assist +1. PT Short Term Goal 4 (Week 3): Pt will complete stand pivot transfers with LRAD & CGA.  Skilled Therapeutic Interventions/Progress Updates:  Pt received in w/c & agreeable to tx. When asked if pt was in pain she states "I'm okay". Transported pt to gym via w/c dependent assist for time management. Pt transfers sit<>stand with min assist with instructional cuing & focus on safe hand placement during transfers. Pt ambulates 40 ft + 55 ft + 66 ft with RW & min assist with pt reporting increased reliance on BUE & seated rest breaks between trials 2/2 BLE fatigue. After last ambulation trial pt's HR = 104-108 bpm, SpO2 = 94% on room air. At end of session pt left in w/c with chair alarm donned & all needs in reach.  Therapy Documentation Precautions:  Precautions Precautions: Fall, Other (comment) Precaution Comments: monitor HR, SpO2 Restrictions Weight Bearing Restrictions: No    Therapy/Group: Individual Therapy  Waunita Schooner 08/05/2019, 10:32 AM

## 2019-08-05 NOTE — Progress Notes (Signed)
PHYSICAL MEDICINE & REHABILITATION PROGRESS NOTE   Subjective/Complaints:  Another good night of sleep. Pain controlled on lower dose (compared to home) of oxycodone. Pleased with progress  ROS: Patient denies fever, rash, sore throat, blurred vision, nausea, vomiting, diarrhea, cough, shortness of breath or chest pain, joint or back pain, headache, or mood change.      Objective:   No results found. Recent Labs    08/04/19 0631  WBC 9.2  HGB 12.0  HCT 38.9  PLT 179   Recent Labs    08/04/19 0631  NA 143  K 3.1*  CL 105  CO2 26  GLUCOSE 174*  BUN 9  CREATININE 0.62  CALCIUM 8.7*    Intake/Output Summary (Last 24 hours) at 08/05/2019 0911 Last data filed at 08/05/2019 0700 Gross per 24 hour  Intake 898 ml  Output -  Net 898 ml     Physical Exam: Vital Signs Blood pressure 128/69, pulse (!) 118, temperature 98.2 F (36.8 C), resp. rate 18, height 5\' 7"  (1.702 m), weight 93.6 kg, SpO2 94 %.  Constitutional: No distress . Vital signs reviewed. HEENT: EOMI, oral membranes moist Neck: supple Cardiovascular: RRR without murmur. No JVD    Respiratory: CTA Bilaterally without wheezes or rales. Normal effort    GI: BS +, non-tender, non-distended    Extremities: No clubbing, cyanosis, or edema. Pulses are 2+ Skin: Clean and intact without signs of breakdown feet remain dry. Neuro: Pt is cognitively appropriate with normal insight, memory, and awareness. Cranial nerves 2-12 are intact. Sensory exam is normal. Reflexes are 1+ in all 4's. Fine motor coordination is intact. No tremors. Motor function is grossly 4/5 UE, 3+/5 HF, 3+/5 KE and 4+/5 ADF/PF.-improving Musculoskeletal: normal AROM.  LB,LE pain with movement Psych: very pleasant   Assessment/Plan: 1. Functional deficits secondary to debility after COVID which require 3+ hours per day of interdisciplinary therapy in a comprehensive inpatient rehab setting.  Physiatrist is providing close team  supervision and 24 hour management of active medical problems listed below.  Physiatrist and rehab team continue to assess barriers to discharge/monitor patient progress toward functional and medical goals  Care Tool:  Bathing    Body parts bathed by patient: Right arm, Left arm, Chest, Abdomen, Face, Right upper leg, Left upper leg   Body parts bathed by helper: Front perineal area, Buttocks, Right lower leg, Left lower leg     Bathing assist Assist Level: Moderate Assistance - Patient 50 - 74%     Upper Body Dressing/Undressing Upper body dressing   What is the patient wearing?: Pull over shirt    Upper body assist Assist Level: Set up assist    Lower Body Dressing/Undressing Lower body dressing      What is the patient wearing?: Pants, Incontinence brief     Lower body assist Assist for lower body dressing: Minimal Assistance - Patient > 75%     Toileting Toileting    Toileting assist Assist for toileting: Maximal Assistance - Patient 25 - 49%     Transfers Chair/bed transfer  Transfers assist  Chair/bed transfer activity did not occur: Safety/medical concerns  Chair/bed transfer assist level: Minimal Assistance - Patient > 75%     Locomotion Ambulation   Ambulation assist   Ambulation activity did not occur: Safety/medical concerns  Assist level: Minimal Assistance - Patient > 75% Assistive device: Walker-rolling Max distance: 30   Walk 10 feet activity   Assist  Walk 10 feet activity did not occur:  Safety/medical concerns  Assist level: Minimal Assistance - Patient > 75% Assistive device: Walker-rolling   Walk 50 feet activity   Assist Walk 50 feet with 2 turns activity did not occur: Safety/medical concerns         Walk 150 feet activity   Assist Walk 150 feet activity did not occur: Safety/medical concerns         Walk 10 feet on uneven surface  activity   Assist Walk 10 feet on uneven surfaces activity did not occur:  Safety/medical concerns         Wheelchair     Assist Will patient use wheelchair at discharge?: Yes Type of Wheelchair: Manual Wheelchair activity did not occur: Safety/medical concerns  Wheelchair assist level: Supervision/Verbal cueing Max wheelchair distance: 200    Wheelchair 50 feet with 2 turns activity    Assist    Wheelchair 50 feet with 2 turns activity did not occur: Safety/medical concerns   Assist Level: Supervision/Verbal cueing   Wheelchair 150 feet activity     Assist  Wheelchair 150 feet activity did not occur: Safety/medical concerns   Assist Level: Supervision/Verbal cueing   Blood pressure 128/69, pulse (!) 118, temperature 98.2 F (36.8 C), resp. rate 18, height 5\' 7"  (1.702 m), weight 93.6 kg, SpO2 94 %.  Medical Problem List and Plan: 1. Impaired mobility and ADLs secondary to COVID-19  --Continue CIR therapies including PT, OT    -team conf today 2. Antithrombotics: -DVT/anticoagulation:Pharmaceutical:Lovenox -antiplatelet therapy: N/A 3.Chronic LBP/Pain Management:    heating pads 3 times per day PRN for low back pain. Continue lidocaine patch. Was on Oxycodone 15mg  PRN PTA.   11/27-for lbp/leg pain increased oxycodone to 5-10mg  q4 prn with good results   -tylenol prn     4. Mood:LCSW to follow for evaluation and support. Team to provide ego support. -antipsychotic agents: N/A 5. Neuropsych: This patientiscapable of making decisions on herown behalf. 6. Skin/Wound Care:Routine pressure relief measures.  -pinpoint anal lesion: continue weight shifting, pressure relief as needed 7. Fluids/Electrolytes/Nutrition:encourage PO  -mild hypokalemia-continue supplement.  -persistent hypokalemia (3.0)--resumed supplementation---will keep standing   -continue protein supplements for low albumin  Recheck labs Thursday   8. COPD: Has been on IV solumedrol --->prednisone tapered to off 9. T2DM with  neuropathy: Continue Lantus 30 units daily.   -was on metformin xr at home pta (will not resume)  -began amaryl 1mg  daily on 11/20 with improvement  -off steroids  -continue SSI for coverage  -12/1 reasonable control 10. H/o depression: Monitor for now--not on any meds at this time. Seems very motivated 11. HTN: Monitor BP tid--currently on metoprolol 25mg  BID and controlled 11/24 12. OSA: CPAP at nights with 2L oxygen bled in.  13. Insomnia: improved   -trazodone increased to home dose with improvement, 300mg  qhs 14. Anxiety: Xanax 0.125mg  BID PRN. Was on 1mg  BID PTA.  15. HLD: Continue atorvastatin 10mg  HS. 16. Leukocytosis d/t steroids  -steroids tapered off  - wbc's 9.2 11/30  LOS: 14 days A FACE TO [place] 08/05/2019, 9:11 AM

## 2019-08-05 NOTE — Patient Care Conference (Signed)
Inpatient RehabilitationTeam Conference and Plan of Care Update Date: 08/05/2019   Time: 10:30 AM   Patient Name: Vanessa Mack      Medical Record Number: [id number]  Date of Birth: [birth year] Sex: Female         Room/Bed: 4W02C/4W02C-01 Payor Info: Payor: MEDICARE / Plan: MEDICARE PART A AND B / Product Type: *No Product type* /    Admit Date/Time:  07/22/2019  1:50 PM  Primary Diagnosis:  Physical debility  Patient Active Problem List   Diagnosis Date Noted  . Physical debility 07/22/2019    Expected Discharge Date: Expected Discharge Date: 08/20/19  Team Members Present: Physician leading conference: Dr. Alger Simons Social Worker Present: Ovidio Kin, LCSW Nurse Present: Judee Clara, LPN Case Manager: Karene Fry, RN PT Present: Lavone Nian, PT OT Present: Cherylynn Ridges, OT SLP Present: Weston Anna, SLP PPS Coordinator present : Gunnar Fusi, SLP     Current Status/Progress Goal Weekly Team Focus  Bowel/Bladder   Continent of bowel and bladder.  Requesting bedpan when in bed.  remain continent of B & B  get up at night for toileting needs   Swallow/Nutrition/ Hydration             ADL's   Min/mod A sit<>stands, Min/CGA stand-pivots w/RW, Min A overall A BADLs  Supervision  sit<>stands, activity tolerance, UB strengtheinng, sef-care retraining   Mobility   supervision bed mobility, supervision w/c mobility, min/mod assist sit<>stand, min assist short distance gait with RW (max 30 ft)  supervision overall  transfers, standing tolerance, bed mobility, gait with LRAD, stairs as able, strengthening, endurance, balance   Communication             Safety/Cognition/ Behavioral Observations            Pain   Complains of chronic pain issues, Oxy IR increased to 5-10mg 's. pain better managed  Pain score < 3  Keep pain score < 3   Skin   Pinpoint red area to anus, coccyx red  No further skin breakdown  assess skin q shift, remind patient to shift weight  while in wheelchair    Rehab Goals Patient on target to meet rehab goals: Yes *See Care Plan and progress notes for long and short-term goals.     Barriers to Discharge  Current Status/Progress Possible Resolutions Date Resolved   Nursing                  PT  Medical stability;Decreased caregiver support  unsure if family can provide 24 hr supervision at d/c              OT                  SLP                SW                Discharge Planning/Teaching Needs:  Pt home with adult sons providing needed support.  Teaching to be planned.   Team Discussion: Slept better, pain control better, doing well.  RN - taking pain meds, BS good, cont B/B.  OT min sit to stand, min A ADLs overall.  PT mod A yesterday, min A amb 55' today, S goals.   Revisions to Treatment Plan: N/A     Medical Summary Current Status: improving stamina, pain better. sleep improved. cbg's much more regulated. very motivated Weekly Focus/Goal: improve stamina and cbg contro  Barriers to Discharge:  Medical stability       Continued Need for Acute Rehabilitation Level of Care: The patient requires daily medical management by a physician with specialized training in physical medicine and rehabilitation for the following reasons: Direction of a multidisciplinary physical rehabilitation program to maximize functional independence : Yes Medical management of patient stability for increased activity during participation in an intensive rehabilitation regime.: Yes Analysis of laboratory values and/or radiology reports with any subsequent need for medication adjustment and/or medical intervention. : Yes   I attest that I was present, lead the team conference, and concur with the assessment and plan of the team.   Retta Diones 08/05/2019, 1:56 PM  Team conference was held via web/ teleconference due to [place] - 19

## 2019-08-06 ENCOUNTER — Inpatient Hospital Stay (HOSPITAL_COMMUNITY): Payer: Medicare Other | Admitting: Occupational Therapy

## 2019-08-06 ENCOUNTER — Inpatient Hospital Stay (HOSPITAL_COMMUNITY): Payer: Medicare Other | Admitting: Physical Therapy

## 2019-08-06 ENCOUNTER — Inpatient Hospital Stay (HOSPITAL_COMMUNITY): Payer: Medicare Other

## 2019-08-06 DIAGNOSIS — R5381 Other malaise: Secondary | ICD-10-CM | POA: Diagnosis not present

## 2019-08-06 LAB — GLUCOSE, CAPILLARY
Glucose-Capillary: 101 mg/dL — ABNORMAL HIGH (ref 70–99)
Glucose-Capillary: 137 mg/dL — ABNORMAL HIGH (ref 70–99)
Glucose-Capillary: 103 mg/dL — ABNORMAL HIGH (ref 70–99)
Glucose-Capillary: 96 mg/dL (ref 70–99)

## 2019-08-06 MED ORDER — TRIAMCINOLONE ACETONIDE 40 MG/ML IJ SUSP
40.0000 mg | Freq: Once | INTRAMUSCULAR | Status: AC
  Administered 2019-08-06: 40 mg via INTRA_ARTICULAR
  Filled 2019-08-06: qty 1

## 2019-08-06 MED ORDER — LIDOCAINE HCL (PF) 1 % IJ SOLN
2.0000 mL | Freq: Once | INTRAMUSCULAR | Status: AC
  Administered 2019-08-06: 2 mL via SUBCUTANEOUS
  Filled 2019-08-06: qty 2

## 2019-08-06 NOTE — Progress Notes (Signed)
Occupational Therapy Session Note  Patient Details  Name: Vanessa Mack MRN: [id number] Date of Birth: 1951/03/06  Today's Date: 08/06/2019 OT Individual Time: 1500-1600 OT Individual Time Calculation (min): 60 min    Short Term Goals: Week 3:  OT Short Term Goal 1 (Week 3): Pt will blow dry hair completely with only set-up a OT Short Term Goal 2 (Week 3): Pt will perform sit<>stand from raised commode with consistent Min A OT Short Term Goal 3 (Week 3): Pt will tolerate standing for 5 minutes within BADL tasks.  Skilled Therapeutic Interventions/Progress Updates:    Pt received sitting up in the w/c with no c/o pain, agreeable to session. Pt completed sit > stand from w/c with CGA. Pt completed functional transfer with RW into the bathroom and onto the [facility]. Pt able to complete all toileting tasks with min A 2/2 brief dropping to the floor. Pt returned to w/c and was transported to the therapy gym. Pt transferred to the mat with CGA. Pt stood from EOM and completed BUE strengthening circuit, graded via weight. 3 trials of 5-7 repetitions. Pt completed mini squats with no UE support, small ROM with cueing provided for technique/encouragement. Activity graded for decreasing challenge as pt fatigued. Pt then stood and completed dynamic standing balance activity with BUE coordination component of catching/throwing. Pt required (S) for balance throughout. Pt completed 3x 20 repetitions with rest breaks provided between each set. Pt returned to her w/c and was transported back to the room. Pt left sitting up with all needs met.   Therapy Documentation Precautions:  Precautions Precautions: Fall, Other (comment) Precaution Comments: monitor HR, SpO2 Restrictions Weight Bearing Restrictions: No   Therapy/Group: Individual Therapy  Curtis Sites 08/06/2019, 7:23 AM

## 2019-08-06 NOTE — Progress Notes (Signed)
[place] PHYSICAL MEDICINE & REHABILITATION PROGRESS NOTE   Subjective/Complaints:  Vanessa Mack had a good night and says she is "blessed." She is pleased with her progress; she has been able to stand at bedside and take a few steps with the RW. Her only complaint is her left shoulder pain which has become more severe and is limiting her ability to walk with the walker as the pressure she applies with her arm is very painful for her shoulder. She says she has been diagnosed with arthritis in the past and benefited from steroid injections. Her last was three years ago.   ROS: Patient denies fever, rash, sore throat, blurred vision, nausea, vomiting, diarrhea, cough, shortness of breath or chest pain, joint or back pain, headache, or mood change.   Objective:   No results found. Recent Labs    08/04/19 0631  WBC 9.2  HGB 12.0  HCT 38.9  PLT 179   Recent Labs    08/04/19 0631  NA 143  K 3.1*  CL 105  CO2 26  GLUCOSE 174*  BUN 9  CREATININE 0.62  CALCIUM 8.7*    Intake/Output Summary (Last 24 hours) at 08/06/2019 1103 Last data filed at 08/06/2019 0720 Gross per 24 hour  Intake 480 ml  Output -  Net 480 ml     Physical Exam: Vital Signs Blood pressure 121/69, pulse 84, temperature 97.9 F (36.6 C), temperature source Oral, resp. rate 16, height 5\' 7"  (1.702 m), weight 93.6 kg, SpO2 94 %.  Constitutional: No distress . Vital signs reviewed. HEENT: EOMI, oral membranes moist Neck: supple Cardiovascular: RRR without murmur. No JVD    Respiratory: CTA Bilaterally without wheezes or rales. Normal effort    GI: BS +, non-tender, non-distended    Extremities: No clubbing, cyanosis, or edema. Pulses are 2+ Skin: Clean and intact without signs of breakdown feet remain dry. Neuro: Pt is cognitively appropriate with normal insight, memory, and awareness. Cranial nerves 2-12 are intact. Sensory exam is normal. Reflexes are 1+ in all 4's. Fine motor coordination is intact. No  tremors. Motor function is grossly 4/5 UE, 3+/5 HF, 3+/5 KE and 4+/5 ADF/PF.-improving Musculoskeletal: LB,LE pain with movement Left shoulder: Tenderness to palpation over glenohumeral joint. Decreased range of motion to 60 degrees upon elevation and 30 degrees with abduction compared to full ROM on right. External rotation limited to 10 degrees, full flexion. +Neer's sign.  Psych: very pleasant   Assessment/Plan: 1. Functional deficits secondary to debility after COVID which require 3+ hours per day of interdisciplinary therapy in a comprehensive inpatient rehab setting.  Physiatrist is providing close team supervision and 24 hour management of active medical problems listed below.  Physiatrist and rehab team continue to assess barriers to discharge/monitor patient progress toward functional and medical goals  Care Tool:  Bathing    Body parts bathed by patient: Right arm, Left arm, Chest, Abdomen, Face, Right upper leg, Left upper leg   Body parts bathed by helper: Front perineal area, Buttocks, Right lower leg, Left lower leg     Bathing assist Assist Level: Moderate Assistance - Patient 50 - 74%     Upper Body Dressing/Undressing Upper body dressing   What is the patient wearing?: Pull over shirt    Upper body assist Assist Level: Set up assist    Lower Body Dressing/Undressing Lower body dressing      What is the patient wearing?: Pants, Incontinence brief     Lower body assist Assist for lower  body dressing: Minimal Assistance - Patient > 75%     Toileting Toileting    Toileting assist Assist for toileting: Maximal Assistance - Patient 25 - 49%     Transfers Chair/bed transfer  Transfers assist  Chair/bed transfer activity did not occur: Safety/medical concerns  Chair/bed transfer assist level: Minimal Assistance - Patient > 75%     Locomotion Ambulation   Ambulation assist   Ambulation activity did not occur: Safety/medical concerns  Assist  level: Minimal Assistance - Patient > 75% Assistive device: Walker-rolling Max distance: 55 ft   Walk 10 feet activity   Assist  Walk 10 feet activity did not occur: Safety/medical concerns  Assist level: Minimal Assistance - Patient > 75% Assistive device: Walker-rolling   Walk 50 feet activity   Assist Walk 50 feet with 2 turns activity did not occur: Safety/medical concerns  Assist level: Minimal Assistance - Patient > 75% Assistive device: Walker-rolling    Walk 150 feet activity   Assist Walk 150 feet activity did not occur: Safety/medical concerns         Walk 10 feet on uneven surface  activity   Assist Walk 10 feet on uneven surfaces activity did not occur: Safety/medical concerns         Wheelchair     Assist Will patient use wheelchair at discharge?: Yes Type of Wheelchair: Manual Wheelchair activity did not occur: Safety/medical concerns  Wheelchair assist level: Supervision/Verbal cueing Max wheelchair distance: 125 ft    Wheelchair 50 feet with 2 turns activity    Assist    Wheelchair 50 feet with 2 turns activity did not occur: Safety/medical concerns   Assist Level: Supervision/Verbal cueing   Wheelchair 150 feet activity     Assist  Wheelchair 150 feet activity did not occur: Safety/medical concerns   Assist Level: Supervision/Verbal cueing   Blood pressure 121/69, pulse 84, temperature 97.9 F (36.6 C), temperature source Oral, resp. rate 16, height 5\' 7"  (1.702 m), weight 93.6 kg, SpO2 94 %.  Medical Problem List and Plan: 1. Impaired mobility and ADLs secondary to COVID-19  --Continue CIR therapies including PT, OT   2. Antithrombotics: -DVT/anticoagulation:Pharmaceutical:Lovenox -antiplatelet therapy: N/A 3.Chronic LBP/Pain Management:    heating pads 3 times per day PRN for low back pain. Continue lidocaine patch. Was on Oxycodone 15mg  PRN PTA.   11/27-for lbp/leg pain increased oxycodone to  5-10mg  q4 prn with good results   -tylenol prn 4. Mood:LCSW to follow for evaluation and support. Team to provide ego support. -antipsychotic agents: N/A 5. Neuropsych: This patientiscapable of making decisions on herown behalf. 6. Skin/Wound Care:Routine pressure relief measures.  -pinpoint anal lesion: continue weight shifting, pressure relief as needed 7. Fluids/Electrolytes/Nutrition:encourage PO  -mild hypokalemia-continue supplement.  -persistent hypokalemia (3.0)--resumed supplementation---will keep standing   -continue protein supplements for low albumin  Recheck labs Thursday   8. COPD: Has been on IV solumedrol --->prednisone tapered to off 9. T2DM with neuropathy: Continue Lantus 30 units daily.   -was on metformin xr at home pta (will not resume)  -began amaryl 1mg  daily on 11/20 with improvement  -off steroids  -continue SSI for coverage  -12/1 reasonable control 10. H/o depression: Monitor for now--not on any meds at this time. Seems very motivated 11. HTN: Monitor BP tid--currently on metoprolol 25mg  BID and controlled 11/24 12. OSA: CPAP at nights with 2L oxygen bled in.  13. Insomnia: improved   -trazodone increased to home dose with improvement, 300mg  qhs 14. Anxiety: Xanax 0.125mg  BID PRN. Was  on 1mg  BID PTA.  15. HLD: Continue atorvastatin 10mg  HS. 16. Leukocytosis d/t steroids  -steroids tapered off  - wbc's 9.2 11/30 17. Left shoulder osteoarthritis: Likely with component of adhesive capsulitis given limited range of motion compared to right side with severely limited external rotation. Patient has benefited from steroid injection in past and would like to try one today. Since pain is limiting function and glucose levels are very well controlled (100 this morning), we will proceed with a steroid injection as follows:  Risks and benefits were discussed and patient would like to proceed with injection: A steroid injection was performed in the  right glenohumeral joint capsule using 0.5% Bupivicaine and 40mg  of triamcinolone. This was well tolerated. Advised that she may feel sore over the next day or two and she could apply ice to the injection site to decrease inflammation.  Bupivicaine Lot [id number], Exp 06/23 Triamcinolone Lot [id number], Exp 07/2020  LOS: 15 days A FACE TO FACE EVALUATION WAS PERFORMED  Deema Juncaj P Beverlyn Mcginness 08/06/2019, 11:03 AM

## 2019-08-06 NOTE — Plan of Care (Signed)
  Problem: RH BOWEL ELIMINATION Goal: RH STG MANAGE BOWEL WITH ASSISTANCE Description: STG Manage Bowel with Min Assistance. Outcome: Progressing   Problem: RH BLADDER ELIMINATION Goal: RH STG MANAGE BLADDER WITH ASSISTANCE Description: STG Manage Bladder With Min Assistance Outcome: Progressing   Problem: RH SKIN INTEGRITY Goal: RH STG SKIN FREE OF INFECTION/BREAKDOWN Description: No new breakdown with min assist  Outcome: Progressing Goal: RH STG ABLE TO PERFORM INCISION/WOUND CARE W/ASSISTANCE Description: STG Able To Perform Incision/Wound Care With Mod Assistance. Outcome: Progressing   Problem: RH SAFETY Goal: RH STG ADHERE TO SAFETY PRECAUTIONS W/ASSISTANCE/DEVICE Description: STG Adhere to Safety Precautions With min Assistance/Device. Outcome: Progressing   Problem: RH PAIN MANAGEMENT Goal: RH STG PAIN MANAGED AT OR BELOW PT'S PAIN GOAL Description: <3 out of 10.  Outcome: Progressing

## 2019-08-06 NOTE — Progress Notes (Signed)
Social Work Patient ID: Vanessa Mack, female   DOB: 1950-09-16, 68 y.o.   MRN: [id number]  Met with pt yesterday to review team conference.  Aware target d/c date remains 12/16 and is very pleased with her ongoing progress.  Grateful to tx team and feels "very blessed to be alive."  Will continue to follow.  Tully Mcinturff, LCSW

## 2019-08-06 NOTE — Progress Notes (Signed)
Occupational Therapy Session Note  Patient Details  Name: Vanessa Mack MRN: [id number] Date of Birth: 11-02-1950  Today's Date: 08/06/2019 OT Individual Time: [id number] Individual Time Calculation (min): 60 min    Short Term Goals: Week 3:  OT Short Term Goal 1 (Week 3): Pt will blow dry hair completely with only set-up a OT Short Term Goal 2 (Week 3): Pt will perform sit<>stand from raised commode with consistent Min A OT Short Term Goal 3 (Week 3): Pt will tolerate standing for 5 minutes within BADL tasks.  Skilled Therapeutic Interventions/Progress Updates:   Treatment session with focus on functional mobility and activity tolerance during self-care tasks.  Pt reporting urgent need to toilet upon arrival.  Completed sit > stand from EOB with Mod assist, ambulated with RW with CGA and min cues for safety due to urgency.  Pt able to complete toileting tasks with CGA for standing. Engaged in bathing in room shower with use of lateral leans when washing perineal area and buttocks.  Pt required increased time and CGA during bathing, with intermittent rest breaks.  Min assist sit > stand from tub bench in room shower and ambulated to sink for LB dressing.  Pt able to complete with setup assist and CGA when standing.  Pt required increased time and effort with any overhead activity (washing and brushing hair), but able to complete with additional effort. Remained upright in w/c with all needs in reach and MD arriving to administer steroid injection.  Therapy Documentation Precautions:  Precautions Precautions: Fall, Other (comment) Precaution Comments: monitor HR, SpO2 Restrictions Weight Bearing Restrictions: No General:   Vital Signs:   Pain: Pain Assessment Pain Scale: 0-10 Pain Score: 7  Pain Type: Chronic pain Pain Location: Shoulder Pain Orientation: Right;Left Pain Descriptors / Indicators: Aching Pain Frequency: Constant Pain Onset: On-going Patients Stated Pain  Goal: 3 Pain Intervention(s): Medication (See eMAR)(oxycodone given)   Therapy/Group: Individual Therapy  Simonne Come 08/06/2019, 2:05 PM

## 2019-08-06 NOTE — Progress Notes (Signed)
Physical Therapy Session Note  Patient Details  Name: Vanessa Mack MRN: [id number] Date of Birth: [birth year]  Today's Date: 08/06/2019 PT Individual Time: 1110-1155 AND 1330-1410 PT Individual Time Calculation (min): 45 min AND 40 min   Short Term Goals: Week 3:  PT Short Term Goal 1 (Week 3): Pt will ambulate 50 ft with LRAD & min assist +1. PT Short Term Goal 2 (Week 3): Pt will negotiate 4 steps with B rails & min assist. PT Short Term Goal 3 (Week 3): Pt will complete car transfer with min assist +1. PT Short Term Goal 4 (Week 3): Pt will complete stand pivot transfers with LRAD & CGA.  Skilled Therapeutic Interventions/Progress Updates:   Session 1:  Pt received in w/c and agreeable to therapy, pain as detailed below. Pt ambulated to day room w/ CGA using RW and close w/c follow for safety, required 2 bouts, 87' and 128'. Pt very excited about this. Worked on functional BLE strengthening in day room. Sit<>stands from elevated mat surface w/o UE assist x7 reps w/ verbal, visual, and tactile cues for technique and upright trunk posture. Partial knee bends and knee marches in stance, 3x10 reps each. Pt self-propelled w/c back to room w/ BUEs and BLEs. Ended session in w/c, all needs in reach.   Session 2:  Pt in w/c and agreeable to therapy, pain as detailed below. Total assist w/c transport to/from therapy gym for time management. Worked on standing tolerance and standing balance while performing bimanual UE tasks. Stood in multiple 3-4 min bouts while performing pipe tree and peg board puzzles. Verbal and tactile cues for upright standing posture, pt often would brace herself against high/low table for support. CGA for safety but no LOB. Performed NuStep 10 min @ level 3 w/ all extremities to work on Ship broker and endurance training. Returned to room and ended session in w/c, all needs in reach.   Therapy Documentation Precautions:  Precautions Precautions: Fall, Other  (comment) Precaution Comments: monitor HR, SpO2 Restrictions Weight Bearing Restrictions: No Pain: Pain Assessment Pain Scale: 0-10 Pain Score: 7  Pain Type: Chronic pain Pain Location: Shoulder Pain Orientation: Right;Left Pain Descriptors / Indicators: Aching Pain Frequency: Constant Pain Onset: On-going Patients Stated Pain Goal: 3 Pain Intervention(s): Medication (See eMAR)(oxycodone given)  Therapy/Group: Individual Therapy  Marvell Tamer K Jackline Castilla 08/06/2019, 1:56 PM

## 2019-08-07 ENCOUNTER — Inpatient Hospital Stay (HOSPITAL_COMMUNITY): Payer: Medicare Other | Admitting: Occupational Therapy

## 2019-08-07 ENCOUNTER — Inpatient Hospital Stay (HOSPITAL_COMMUNITY): Payer: Medicare Other | Admitting: Physical Therapy

## 2019-08-07 DIAGNOSIS — E876 Hypokalemia: Secondary | ICD-10-CM | POA: Diagnosis not present

## 2019-08-07 DIAGNOSIS — G479 Sleep disorder, unspecified: Secondary | ICD-10-CM | POA: Diagnosis not present

## 2019-08-07 DIAGNOSIS — E119 Type 2 diabetes mellitus without complications: Secondary | ICD-10-CM | POA: Diagnosis not present

## 2019-08-07 DIAGNOSIS — R5381 Other malaise: Secondary | ICD-10-CM | POA: Diagnosis not present

## 2019-08-07 LAB — GLUCOSE, CAPILLARY
Glucose-Capillary: 123 mg/dL — ABNORMAL HIGH (ref 70–99)
Glucose-Capillary: 108 mg/dL — ABNORMAL HIGH (ref 70–99)
Glucose-Capillary: 57 mg/dL — ABNORMAL LOW (ref 70–99)
Glucose-Capillary: 148 mg/dL — ABNORMAL HIGH (ref 70–99)
Glucose-Capillary: 104 mg/dL — ABNORMAL HIGH (ref 70–99)

## 2019-08-07 NOTE — Progress Notes (Signed)
Occupational Therapy Session Note  Patient Details  Name: Vanessa Mack MRN: [id number] Date of Birth: 1951/06/25  Today's Date: 08/07/2019  Session 1 OT Individual Time: 0847-1000 OT Individual Time Calculation (min): 73 min   Session 2 OT Individual Time: 2426-8341 OT Individual Time Calculation (min): 59 min    Short Term Goals: Week 3:  OT Short Term Goal 1 (Week 3): Pt will blow dry hair completely with only set-up a OT Short Term Goal 2 (Week 3): Pt will perform sit<>stand from raised commode with consistent Min A OT Short Term Goal 3 (Week 3): Pt will tolerate standing for 5 minutes within BADL tasks.  Skilled Therapeutic Interventions/Progress Updates:  Session 1   Pt greeted semi-reclined in bed and agreeable to OT treatment session. Pt came to sitting EOB with supervision. Practiced sit<>stand from lower bed height w/ RW and Min A. Pt then ambulated into bathroom to tub bench with CGA. Bathing completed with min A overall. Dressing from wc at the sink with pt able to thread BLEs into pants, then min A to stand and CGA for balance while pulling pants up over hips. Worked on standing balance/endurance with pt standing for 2 minutes to brush hair and for OT to braid hair. B UE strength/coordination with wc propulsion to day room. Worked on standing balance/endurance with standing corn hole task without AD. Pt needed min/CGA overall for balance. Pt propelled wc back to room and pt left sitting in wc with call bell in reach and needs met.   Session 2 Pt greeted supine in bed. Pt reported low BP earlier after sitting in wc and had to lay down. Pt came to sitting EOB with min A and HOB flat. Pt rested at EOB and BP taken in sitting.  126/76 HR100. Pt completed sit<>stand from lower bed with mod A and RW. BP standing: 115/82 HR 115. Pt reported need to urinate, so ambulated to the bathroom w/ RW and CGA. CGA for balance when managing clothing. Pt voided bladder and completed toileting  with min A. Pt brought down to therapy gym in wc. Stand-pivot ot therapy mat with Min A to stand and CGA for pivot. Pt completed 2 sets of 10 ball toss with abdominal twist. Pt then reported need to have a BM. Pt returned to room in wc and ambulated 5 feet into bathroom w/ min A. Pt with successful BM. Pt able to complete hygiene with min A for thoroughness. Pt had to sit to wash hands 2/2 ftaigue. LB there-ex seated in wc 3 sets of 10 seated hip extension, hip adduction, and knee extension. Stand-pivot back to bed at end of session with min A and no AD. Pt left semi-reclined in bed with bed alarm on, call bell in reach, and needs met.   Therapy Documentation Precautions:  Precautions Precautions: Fall, Other (comment) Precaution Comments: monitor HR, SpO2 Restrictions Weight Bearing Restrictions: No Pain: Pain Assessment Pain Scale: 0-10 Pain Score: 4  Pain Type: Acute pain Pain Location: Back Pain Orientation: Lower  Repositioned for comfort  Therapy/Group: Individual Therapy  Valma Cava 08/07/2019, 3:00 PM

## 2019-08-07 NOTE — Progress Notes (Signed)
[place] PHYSICAL MEDICINE & REHABILITATION PROGRESS NOTE   Subjective/Complaints:  No new issues. Left shoulder still sore. ?perhaps some relief initially after injection. Hurting again this morning. Willing to work through it however.   ROS: Patient denies fever, rash, sore throat, blurred vision, nausea, vomiting, diarrhea, cough, shortness of breath or chest pain , headache, or mood change.   Objective:   No results found. No results for input(s): WBC, HGB, HCT, PLT in the last 72 hours. No results for input(s): NA, K, CL, CO2, GLUCOSE, BUN, CREATININE, CALCIUM in the last 72 hours.  Intake/Output Summary (Last 24 hours) at 08/07/2019 0949 Last data filed at 08/07/2019 0900 Gross per 24 hour  Intake 240 ml  Output -  Net 240 ml     Physical Exam: Vital Signs Blood pressure 102/68, pulse 99, temperature 98.6 F (37 C), temperature source Oral, resp. rate 20, height 5\' 7"  (1.702 m), weight 93.6 kg, SpO2 93 %.  Constitutional: No distress . Vital signs reviewed. HEENT: EOMI, oral membranes moist Neck: supple Cardiovascular: RRR without murmur. No JVD    Respiratory: CTA Bilaterally without wheezes or rales. Normal effort    GI: BS +, non-tender, non-distended     Extremities: No clubbing, cyanosis, or edema. Pulses are 2+ Skin: Clean and intact without signs of breakdown feet remain dry. Neuro: Pt is cognitively appropriate with normal insight, memory, and awareness. Cranial nerves 2-12 are intact. Sensory exam is normal. Reflexes are 1+ in all 4's. Fine motor coordination is intact. No tremors. Motor function is grossly 4/5 UE, 3+/5 HF, 3+/5 KE and 4+/5 ADF/PF.stable Musculoskeletal: LB,LE pain with movement Left shoulder: pain with IR/impingement maneuver, AC jt tender also, subacromial area with generalized tenderness. Fair PROM Psych: very pleasant   Assessment/Plan: 1. Functional deficits secondary to debility after COVID which require 3+ hours per day of  interdisciplinary therapy in a comprehensive inpatient rehab setting.  Physiatrist is providing close team supervision and 24 hour management of active medical problems listed below.  Physiatrist and rehab team continue to assess barriers to discharge/monitor patient progress toward functional and medical goals  Care Tool:  Bathing    Body parts bathed by patient: Right arm, Left arm, Chest, Abdomen, Face, Right upper leg, Left upper leg, Front perineal area, Buttocks   Body parts bathed by helper: Front perineal area, Buttocks, Right lower leg, Left lower leg     Bathing assist Assist Level: Contact Guard/Touching assist     Upper Body Dressing/Undressing Upper body dressing   What is the patient wearing?: Pull over shirt    Upper body assist Assist Level: Set up assist    Lower Body Dressing/Undressing Lower body dressing      What is the patient wearing?: Pants, Incontinence brief     Lower body assist Assist for lower body dressing: Minimal Assistance - Patient > 75%     Toileting Toileting    Toileting assist Assist for toileting: Minimal Assistance - Patient > 75%     Transfers Chair/bed transfer  Transfers assist  Chair/bed transfer activity did not occur: Safety/medical concerns  Chair/bed transfer assist level: Minimal Assistance - Patient > 75%     Locomotion Ambulation   Ambulation assist   Ambulation activity did not occur: Safety/medical concerns  Assist level: Contact Guard/Touching assist Assistive device: Walker-rolling Max distance: 128'   Walk 10 feet activity   Assist  Walk 10 feet activity did not occur: Safety/medical concerns  Assist level: Contact Guard/Touching assist Assistive device: Walker-rolling  Walk 50 feet activity   Assist Walk 50 feet with 2 turns activity did not occur: Safety/medical concerns  Assist level: Contact Guard/Touching assist Assistive device: Walker-rolling    Walk 150 feet  activity   Assist Walk 150 feet activity did not occur: Safety/medical concerns         Walk 10 feet on uneven surface  activity   Assist Walk 10 feet on uneven surfaces activity did not occur: Safety/medical concerns         Wheelchair     Assist Will patient use wheelchair at discharge?: Yes Type of Wheelchair: Manual Wheelchair activity did not occur: Safety/medical concerns  Wheelchair assist level: Supervision/Verbal cueing Max wheelchair distance: 125 ft    Wheelchair 50 feet with 2 turns activity    Assist    Wheelchair 50 feet with 2 turns activity did not occur: Safety/medical concerns   Assist Level: Supervision/Verbal cueing   Wheelchair 150 feet activity     Assist  Wheelchair 150 feet activity did not occur: Safety/medical concerns   Assist Level: Supervision/Verbal cueing   Blood pressure 102/68, pulse 99, temperature 98.6 F (37 C), temperature source Oral, resp. rate 20, height 5\' 7"  (1.702 m), weight 93.6 kg, SpO2 93 %.  Medical Problem List and Plan: 1. Impaired mobility and ADLs secondary to COVID-19  --Continue CIR therapies including PT, OT   2. Antithrombotics: -DVT/anticoagulation:Pharmaceutical:Lovenox -antiplatelet therapy: N/A 3.Chronic LBP/Pain Management:    heating pads 3 times per day PRN for low back pain. Continue lidocaine patch. Was on Oxycodone 15mg  PRN PTA.   11/27-for lbp/leg pain increased oxycodone to 5-10mg  q4 prn with good results   -tylenol prn  12/3: left shoulder pain-- mild adh cap, RTC, AC jt arthritis   -modest relief with injection yesterday ---observe today   -rom with therapies 4. Mood:LCSW to follow for evaluation and support. Team to provide ego support. -antipsychotic agents: N/A 5. Neuropsych: This patientiscapable of making decisions on herown behalf. 6. Skin/Wound Care:Routine pressure relief measures.  -pinpoint anal lesion: continue weight shifting,  pressure relief as needed 7. Fluids/Electrolytes/Nutrition:encourage PO  -mild hypokalemia-continue supplement.  -persistent hypokalemia (3.1)--resumed supplementation---will keep standing   -continue protein supplements for low albumin  Recheck labs Friday  8. COPD: Has been on IV solumedrol --->prednisone tapered to off 9. T2DM with neuropathy: Continue Lantus 30 units daily.   -was on metformin xr at home pta (will not resume)  -began amaryl 1mg  daily on 11/20 with improvement  -off steroids  -continue SSI for coverage  -12/3- reasonable control. Observe after steroid injeciton 10. H/o depression: Monitor for now--not on any meds at this time. Seems very motivated 11. HTN: Monitor BP tid--currently on metoprolol 25mg  BID and controlled 11/24 12. OSA: CPAP at nights with 2L oxygen bled in.  13. Insomnia: improved   -trazodone increased to home dose with improvement, 300mg  qhs 14. Anxiety: Xanax 0.125mg  BID PRN. Was on 1mg  BID PTA.  15. HLD: Continue atorvastatin 10mg  HS. 16. Leukocytosis d/t steroids  -steroids tapered off  - wbc's 9.2 11/30   LOS: 16 days A FACE TO [place] 08/07/2019, 9:49 AM

## 2019-08-07 NOTE — Progress Notes (Signed)
Physical Therapy Session Note  Patient Details  Name: Vanessa Mack MRN: [id number] Date of Birth: [birth year]  Today's Date: 08/07/2019 PT Individual Time: 1025-1120 PT Individual Time Calculation (min): 55 min   Short Term Goals: Week 3:  PT Short Term Goal 1 (Week 3): Pt will ambulate 50 ft with LRAD & min assist +1. PT Short Term Goal 2 (Week 3): Pt will negotiate 4 steps with B rails & min assist. PT Short Term Goal 3 (Week 3): Pt will complete car transfer with min assist +1. PT Short Term Goal 4 (Week 3): Pt will complete stand pivot transfers with LRAD & CGA.  Skilled Therapeutic Interventions/Progress Updates:   Pt in w/c and agreeable to therapy, pain as detailed below in B knees. Pt ambulated to therapy gym w/ 1 seated rest break 2/2 increased knee pain in WB, 240' in total w/ close supervision-CGA. Verbal cues for gait pattern including heel-to-toe gait pattern and increased fluidity of gait. Pt w/ updates regarding equipment needs and d/c planning - left note for primary PT. Worked on functional BLE strengthening in gym. Performed 5x sit<>stands w/ CGA-min assist x2 reps. Negotiated 4 (3") steps w/ B rails, performed x2 reps w/ min assist. Ambulated back to room w/ close supervision-CGA, 260' w/ 1 rest break using RW. Ended session in w/c and all needs in reach. Ice applied to B knees for pain and soreness relief. RN made aware of pt's request for pain medication.   Therapy Documentation Precautions:  Precautions Precautions: Fall, Other (comment) Precaution Comments: monitor HR, SpO2 Restrictions Weight Bearing Restrictions: No Pain: Pain Assessment Pain Scale: 0-10 Pain Score: 8  Pain Type: Acute pain Pain Location: Back Pain Orientation: Lower  Therapy/Group: Individual Therapy  Daegen Berrocal Clent Demark 08/07/2019, 11:34 AM

## 2019-08-08 ENCOUNTER — Inpatient Hospital Stay (HOSPITAL_COMMUNITY): Payer: Medicare Other | Admitting: Physical Therapy

## 2019-08-08 ENCOUNTER — Inpatient Hospital Stay (HOSPITAL_COMMUNITY): Payer: Medicare Other | Admitting: Occupational Therapy

## 2019-08-08 DIAGNOSIS — R5381 Other malaise: Secondary | ICD-10-CM | POA: Diagnosis not present

## 2019-08-08 DIAGNOSIS — E119 Type 2 diabetes mellitus without complications: Secondary | ICD-10-CM | POA: Diagnosis not present

## 2019-08-08 DIAGNOSIS — G479 Sleep disorder, unspecified: Secondary | ICD-10-CM | POA: Diagnosis not present

## 2019-08-08 DIAGNOSIS — E876 Hypokalemia: Secondary | ICD-10-CM | POA: Diagnosis not present

## 2019-08-08 LAB — BASIC METABOLIC PANEL
Anion gap: 8 (ref 5–15)
BUN: 8 mg/dL (ref 8–23)
CO2: 27 mmol/L (ref 22–32)
Calcium: 8.5 mg/dL — ABNORMAL LOW (ref 8.9–10.3)
Chloride: 108 mmol/L (ref 98–111)
Creatinine, Ser: 0.49 mg/dL (ref 0.44–1.00)
GFR calc Af Amer: >60 mL/min (ref >60)
GFR calc non Af Amer: >60 mL/min (ref >60)
Glucose, Bld: 121 mg/dL — ABNORMAL HIGH (ref 70–99)
Potassium: 3.9 mmol/L (ref 3.5–5.1)
Sodium: 143 mmol/L (ref 135–145)

## 2019-08-08 LAB — CBC
HCT: 35.3 % — ABNORMAL LOW (ref 36.0–46.0)
Hemoglobin: 10.8 g/dL — ABNORMAL LOW (ref 12.0–15.0)
MCH: 29 pg (ref 26.0–34.0)
MCHC: 30.6 g/dL (ref 30.0–36.0)
MCV: 94.9 fL (ref 80.0–100.0)
Platelets: 224 10*3/uL (ref 150–400)
RBC: 3.72 MIL/uL — ABNORMAL LOW (ref 3.87–5.11)
RDW: 15.4 % (ref 11.5–15.5)
WBC: 6.1 10*3/uL (ref 4.0–10.5)
nRBC: 0 % (ref 0.0–0.2)

## 2019-08-08 LAB — GLUCOSE, CAPILLARY
Glucose-Capillary: 119 mg/dL — ABNORMAL HIGH (ref 70–99)
Glucose-Capillary: 134 mg/dL — ABNORMAL HIGH (ref 70–99)
Glucose-Capillary: 90 mg/dL (ref 70–99)
Glucose-Capillary: 134 mg/dL — ABNORMAL HIGH (ref 70–99)

## 2019-08-08 NOTE — Progress Notes (Signed)
Occupational Therapy Session Note  Patient Details  Name: Vanessa Mack MRN: [id number] Date of Birth: 27-Dec-1950  Today's Date: 08/08/2019 OT Individual Time: 8845-7334 OT Individual Time Calculation (min): 75 min    Short Term Goals: Week 3:  OT Short Term Goal 1 (Week 3): Pt will blow dry hair completely with only set-up a OT Short Term Goal 2 (Week 3): Pt will perform sit<>stand from raised commode with consistent Min A OT Short Term Goal 3 (Week 3): Pt will tolerate standing for 5 minutes within BADL tasks.  Skilled Therapeutic Interventions/Progress Updates:    Pt greeted seated in wc and agreeable to OT treatment session. Pt requesting to shower today. Sit<>stand from wc w/ RW and min A. PT then ambulated into bathroom w/ RW and CGA. Bathing completed with set-up A and LH sponge issued to help pt wash her feet. Pt fatigued with showering, with UE fatigue when washing hair requiring rest breaks. Pt ambulated out of shower with RW and CGA. Pt took seated rest break and donned shirt with set-up A. LB dressing min A from wc level. Worked on standing balance/endurance while blow drying hair today. Pt tolerated standing for 2 minutes before reaching max fatigue. Pt able to don socks today with feet propped on leg rests. Pt brought to therapy gym and worked on fine motor coordination with push pin activity. Pt returned to room and left seated in wc with call bell in reach, chair alarm on, and needs met.   Therapy Documentation Precautions:  Precautions Precautions: Fall, Other (comment) Precaution Comments: monitor HR, SpO2 Restrictions Weight Bearing Restrictions: No Pain: Pain Assessment Pain Scale: 0-10 Pain Score: 4  L shoulder pain, repositioned for comfort and head applied  Therapy/Group: Individual Therapy  Valma Cava 08/08/2019, 10:25 AM

## 2019-08-08 NOTE — Progress Notes (Signed)
Physical Therapy Session Note  Patient Details  Name: Vanessa Mack MRN: [id number] Date of Birth: [birth year]  Today's Date: 08/08/2019 PT Individual Time: 2000-2040 PT Individual Time Calculation (min): 40 min   Short Term Goals: Week 3:  PT Short Term Goal 1 (Week 3): Pt will ambulate 50 ft with LRAD & min assist +1. PT Short Term Goal 2 (Week 3): Pt will negotiate 4 steps with B rails & min assist. PT Short Term Goal 3 (Week 3): Pt will complete car transfer with min assist +1. PT Short Term Goal 4 (Week 3): Pt will complete stand pivot transfers with LRAD & CGA.  Skilled Therapeutic Interventions/Progress Updates:   Pt in supine and agreeable to therapy, pain 5-6/10 in B knees and L shoulder (premedicated). Supervision bed mobility. Ambulated to day room w/ close supervision using RW ~150' w/o stopping! Continues to need verbal cues for gait pattern, to increase control over steps and increased fluidity of gait. Brief seated rest break prior to NuStep. NuStep 5 min x2 @ level 2 w/ all extremities to work on functional endurance and global strengthening. Ambulated back to room, 1 seated rest break halfway. Improved fluidity of gait during this bout. Ended session in w/c, all needs in reach. Ice applied to B knees for pain and soreness relief. Pt reports ice after yesterday's session helped.   Therapy Documentation Precautions:  Precautions Precautions: Fall, Other (comment) Precaution Comments: monitor HR, SpO2 Restrictions Weight Bearing Restrictions: No  Therapy/Group: Individual Therapy  Ranae Casebier Clent Demark 08/08/2019, 8:46 AM

## 2019-08-08 NOTE — Progress Notes (Signed)
Physical Therapy Session Note  Patient Details  Name: Vanessa Mack MRN: [id number] Date of Birth: 12-21-1950  Today's Date: 08/08/2019 PT Individual Time: [id number] PT Individual Time Calculation (min): 70 min   Short Term Goals: Week 3:  PT Short Term Goal 1 (Week 3): Pt will ambulate 50 ft with LRAD & min assist +1. PT Short Term Goal 2 (Week 3): Pt will negotiate 4 steps with B rails & min assist. PT Short Term Goal 3 (Week 3): Pt will complete car transfer with min assist +1. PT Short Term Goal 4 (Week 3): Pt will complete stand pivot transfers with LRAD & CGA.  Skilled Therapeutic Interventions/Progress Updates:  Pt received in bathroom in handoff from nurse. Pt was able to manage clothing without assistance and ambulate out of bathroom with RW & supervision. Pt performs hand hygiene standing at sink with close supervision. Pt completes sit<>stand transfers with supervision & RW. Pt ambulates room>nurses station with RW & supervision with decreased gait speed when pt reports need to use restroom so assisted pt back to room via w/c dependent assist for time management. Pt ambulates into bathroom with RW & supervision and performs clothing management without assistance. Pt with continent BM on toilet & performs peri hygiene without assistance (therapist only assists afterwards to ensure cleanliness & pt was thorough with self care task), then hand hygiene standing at sink with supervision. Pt's son Vanessa Mack) arrived & therapist educated him on pt's ELOS, recommended DME, supervision level goals, and HHPT f/u, then pt's son left. Pt ambulates room>nurses station with RW & supervision, then propels w/c to gym with [place] and supervision. Pt then requires multiple attempts but ultimately supervision for sit>stand then stand pivot to mat table from w/c with RW. Pt engaged in zoom ball while standing ~1 minute + ~ 2 minutes + ~1 minute with task focusing on overall strengthening & endurance  training - pt mainly limited by BUE fatigue with task. Pt performed sit<>stand from elevated EOM with supervision progressing to min assist as pt fatigues with task focusing on BLE strengthening & pt able to complete 2 out of 3 reps with supervision which is a great improvement from last attempt at task with this therapist when pt had to utilize 1 UE support. Pt performed 3 reps as noted prior, then 4 reps with supervision. Pt transfers back to w/c with RW & supervision for stand pivot. Assisted pt back to room where she reports she has an elevated bed height with step to enter - have asked pt to have son's lower bed height & take new measurement & plan to f/u on this with pt so pt can practice bed mobility to simulate home environment. Pt transfers w/c>bed with RW & supervision and sit>supine with bed flat, no rails & supervision. Pt left in bed with alarm set & all needs at hand.  Therapy Documentation Precautions:  Precautions Precautions: Fall, Other (comment) Precaution Comments: monitor HR, SpO2 Restrictions Weight Bearing Restrictions: No  Pain: Pt reports 6/10 chronic pain in neck, back, knees, & shoulders - pt reports she's premedicated & rest breaks given PRN  Therapy/Group: Individual Therapy  Waunita Schooner 08/08/2019, 2:20 PM

## 2019-08-08 NOTE — Progress Notes (Signed)
[facility] PHYSICAL MEDICINE & REHABILITATION PROGRESS NOTE   Subjective/Complaints:  Up with PT. Walking down the hall. Left shoulder still sore but able to work thru it  ROS: Patient denies fever, rash, sore throat, blurred vision, nausea, vomiting, diarrhea, cough, shortness of breath or chest pain,  headache, or mood change.    Objective:   No results found. Recent Labs    08/08/19 0534  WBC 6.1  HGB 10.8*  HCT 35.3*  PLT 224   Recent Labs    08/08/19 0534  NA 143  K 3.9  CL 108  CO2 27  GLUCOSE 121*  BUN 8  CREATININE 0.49  CALCIUM 8.5*    Intake/Output Summary (Last 24 hours) at 08/08/2019 1011 Last data filed at 08/08/2019 0802 Gross per 24 hour  Intake 780 ml  Output -  Net 780 ml     Physical Exam: Vital Signs Blood pressure 122/77, pulse (!) 106, temperature 98.4 F (36.9 C), temperature source Oral, resp. rate 18, height 5\' 7"  (1.702 m), weight 99.9 kg, SpO2 97 %.  Constitutional: No distress . Vital signs reviewed. HEENT: EOMI, oral membranes moist Neck: supple Cardiovascular: tachy without murmur. No JVD    Respiratory: CTA Bilaterally without wheezes or rales. Normal effort    GI: BS +, non-tender, non-distended  Extremities: No clubbing, cyanosis, or edema. Pulses are 2+ Skin: Clean and intact without signs of breakdown . Neuro: Pt is cognitively appropriate with normal insight, memory, and awareness. Cranial nerves 2-12 are intact. Sensory exam is normal. Reflexes are 1+ in all 4's. Fine motor coordination is intact. No tremors. Motor function is grossly 4/5 UE, 3+/5 HF, 3+/5 KE and 4+/5 ADF/PF. Mild steppage pattern to gait but improves with cueing.  Musculoskeletal: LB,LE pain with movement Left shoulder pain moderate with IR/ER/ABD. AC jt tender to palpation and with cross armed maneuver Psych: very pleasant   Assessment/Plan: 1. Functional deficits secondary to debility after COVID which require 3+ hours per day of interdisciplinary  therapy in a comprehensive inpatient rehab setting.  Physiatrist is providing close team supervision and 24 hour management of active medical problems listed below.  Physiatrist and rehab team continue to assess barriers to discharge/monitor patient progress toward functional and medical goals  Care Tool:  Bathing    Body parts bathed by patient: Right arm, Left arm, Chest, Abdomen, Face, Right upper leg, Left upper leg, Front perineal area, Buttocks   Body parts bathed by helper: Front perineal area, Buttocks, Right lower leg, Left lower leg     Bathing assist Assist Level: Contact Guard/Touching assist     Upper Body Dressing/Undressing Upper body dressing   What is the patient wearing?: Pull over shirt    Upper body assist Assist Level: Set up assist    Lower Body Dressing/Undressing Lower body dressing      What is the patient wearing?: Pants, Incontinence brief     Lower body assist Assist for lower body dressing: Minimal Assistance - Patient > 75%     Toileting Toileting    Toileting assist Assist for toileting: Minimal Assistance - Patient > 75%     Transfers Chair/bed transfer  Transfers assist  Chair/bed transfer activity did not occur: Safety/medical concerns  Chair/bed transfer assist level: Minimal Assistance - Patient > 75%     Locomotion Ambulation   Ambulation assist   Ambulation activity did not occur: Safety/medical concerns  Assist level: Supervision/Verbal cueing Assistive device: Walker-rolling Max distance: 150'   Walk 10 feet activity  Assist  Walk 10 feet activity did not occur: Safety/medical concerns  Assist level: Supervision/Verbal cueing Assistive device: Walker-rolling   Walk 50 feet activity   Assist Walk 50 feet with 2 turns activity did not occur: Safety/medical concerns  Assist level: Supervision/Verbal cueing Assistive device: Walker-rolling    Walk 150 feet activity   Assist Walk 150 feet activity  did not occur: Safety/medical concerns  Assist level: Supervision/Verbal cueing Assistive device: Walker-rolling    Walk 10 feet on uneven surface  activity   Assist Walk 10 feet on uneven surfaces activity did not occur: Safety/medical concerns         Wheelchair     Assist Will patient use wheelchair at discharge?: Yes Type of Wheelchair: Manual Wheelchair activity did not occur: Safety/medical concerns  Wheelchair assist level: Supervision/Verbal cueing Max wheelchair distance: 125 ft    Wheelchair 50 feet with 2 turns activity    Assist    Wheelchair 50 feet with 2 turns activity did not occur: Safety/medical concerns   Assist Level: Supervision/Verbal cueing   Wheelchair 150 feet activity     Assist  Wheelchair 150 feet activity did not occur: Safety/medical concerns   Assist Level: Supervision/Verbal cueing   Blood pressure 122/77, pulse (!) 106, temperature 98.4 F (36.9 C), temperature source Oral, resp. rate 18, height 5\' 7"  (1.702 m), weight 99.9 kg, SpO2 97 %.  Medical Problem List and Plan: 1. Impaired mobility and ADLs secondary to COVID-19  --Continue CIR therapies including PT, OT--progressing towards goals   2. Antithrombotics: -DVT/anticoagulation:Pharmaceutical:Lovenox -antiplatelet therapy: N/A 3.Chronic LBP/Pain Management:    heating pads 3 times per day PRN for low back pain. Continue lidocaine patch. Was on Oxycodone 15mg  PRN PTA.   11/27-for lbp/leg pain increased oxycodone to 5-10mg  q4 prn with good results   -tylenol prn  12/3: left shoulder pain-- mild adh cap, RTC, AC jt arthritis also   -modest relief with injection 12/2    -rom with therapies 4. Mood:LCSW to follow for evaluation and support. Team to provide ego support. -antipsychotic agents: N/A 5. Neuropsych: This patientiscapable of making decisions on herown behalf. 6. Skin/Wound Care:Routine pressure relief  measures.  -pinpoint anal lesion improving 7. Fluids/Electrolytes/Nutrition:encourage PO  -mild hypokalemia-continue supplement.  -persistent hypokalemia--resumed supplementation-     -potassium 3.9 12/4--continue supp 8. COPD: Has been on IV solumedrol --->prednisone tapered to off 9. T2DM with neuropathy: Continue Lantus 30 units daily.   -was on metformin xr at home pta (will not resume-discussed with her)  -began amaryl 1mg  daily on 11/20   -off steroids  -continue SSI for coverage  -12/4: had one low reading yesterday. Otherwise much improved/steady 10. H/o depression: Monitor for now--not on any meds at this time. Seems very motivated 11. HTN: Monitor BP tid--currently on metoprolol 25mg  BID    12/4 had drop yesterday afternoon---unclear re: etiology---observe for now   -encourage adequate fluids, she is eating well 12. OSA: CPAP at nights with 2L oxygen bled in.  13. Insomnia: improved   -trazodone increased to home dose with improvement, 300mg  qhs 14. Anxiety: Xanax 0.125mg  BID PRN. Was on 1mg  BID PTA.  15. HLD: Continue atorvastatin 10mg  HS. 16. Leukocytosis d/t steroids  -steroids tapered off  - wbc's 6.1 12/4   LOS: 17 days A FACE TO [place] 08/08/2019, 10:11 AM

## 2019-08-09 ENCOUNTER — Inpatient Hospital Stay (HOSPITAL_COMMUNITY): Payer: Medicare Other | Admitting: Physical Therapy

## 2019-08-09 DIAGNOSIS — R5381 Other malaise: Secondary | ICD-10-CM | POA: Diagnosis not present

## 2019-08-09 LAB — GLUCOSE, CAPILLARY
Glucose-Capillary: 123 mg/dL — ABNORMAL HIGH (ref 70–99)
Glucose-Capillary: 87 mg/dL (ref 70–99)
Glucose-Capillary: 57 mg/dL — ABNORMAL LOW (ref 70–99)
Glucose-Capillary: 187 mg/dL — ABNORMAL HIGH (ref 70–99)
Glucose-Capillary: 94 mg/dL (ref 70–99)

## 2019-08-09 NOTE — Progress Notes (Signed)
Physical Therapy Weekly Progress Note  Patient Details  Name: Vanessa Mack MRN: [id number] Date of Birth: 05-20-1951  Beginning of progress report period: August 04, 2019 End of progress report period: August 09, 2019  Today's Date: 08/09/2019 PT Individual Time: 0910-1003 PT Individual Time Calculation (min): 53 min   Patient has met 4 of 4 short term goals.  Pt is making good progress towards LTG's as she is currently able to perform functional mobility tasks with RW & close supervision, except min assist for stairs with B rails & car transfer at SUV simulated height. Pt would benefit from continued skilled PT treatment to focus on endurance, stair negotiation, strengthening & car transfer. Pt will also benefit from hands on caregiver training prior to pt's d/c to ensure safety with pt & caregivers upon d/c home.  Patient continues to demonstrate the following deficits muscle weakness, decreased cardiorespiratory endurance, and decreased standing balance, decreased postural control and decreased balance strategies and therefore will continue to benefit from skilled PT intervention to increase functional independence with mobility.  Patient progressing toward long term goals..  Continue plan of care.  PT Short Term Goals Week 3:  PT Short Term Goal 1 (Week 3): Pt will ambulate 50 ft with LRAD & min assist +1. PT Short Term Goal 1 - Progress (Week 3): Met PT Short Term Goal 2 (Week 3): Pt will negotiate 4 steps with B rails & min assist. PT Short Term Goal 2 - Progress (Week 3): Met PT Short Term Goal 3 (Week 3): Pt will complete car transfer with min assist +1. PT Short Term Goal 3 - Progress (Week 3): Met PT Short Term Goal 4 (Week 3): Pt will complete stand pivot transfers with LRAD & CGA. PT Short Term Goal 4 - Progress (Week 3): Met Week 4:  PT Short Term Goal 1 (Week 4): STG = LTG due to estimated d/c date.  Skilled Therapeutic Interventions/Progress Updates:  Pt received  in bed & agreeable to tx. Pt reports her sons plan to lower her bed height at home. Pt transferred supine>sit with supervision, bed flat, no rails, & extra time. Pt transfers sit>stand with supervision and ambulates in room/bathroom with RW & close supervision. Pt performs toilet transfer & clothing management with supervision & pt has continent void on toilet. Pt performs hand hygiene standing at sink with supervision then pt returns to sitting in w/c to allow therapist to braid hair total assist for time management. Pt requesting to attempt gait without AD today. Pt ambulates room>gym with RW & close supervision without any rest break! In gym, pt transferred to sitting on EOM & therapist donned maxi sky sling. Pt ambulates 30 ft x 3 with min assist without AD with use of maxi sky sling but no knee buckling noted during activity, pt with decreased gait speed, decreased weight shifting L, decreased step length BLE and noticeable weakness in BLE hip flexors. [facility] not supporting pt's weight at all, slack left in sling, sling only used as precaution in case pt's BLE buckled). Pt ambulates gym>nurses station with RW & supervision before requiring a seated rest break 2/2 feeling weak. Pt returned to room via w/c dependent assist for energy conservation. At end of session pt left in w/c with chair alarm donned & all needs at hand.  Therapy Documentation Precautions:  Precautions Precautions: Fall, Other (comment) Precaution Comments: monitor HR, SpO2 Restrictions Weight Bearing Restrictions: No  Pain: Pt c/o shooting pain in L shoulder - rest breaks  provided PRN & pt reports she's premedicated.   Therapy/Group: Individual Therapy  Waunita Schooner 08/09/2019, 10:05 AM

## 2019-08-09 NOTE — Progress Notes (Signed)
[place] PHYSICAL MEDICINE & REHABILITATION PROGRESS NOTE   Subjective/Complaints:  Patient feels at times like she is going to pass out.  This happens when she is just sitting or laying around not when she is changing her positions. Patient's pulse has been elevated blood pressures have been ranging from the 90s to 120s No orthostatic BPs recorded  ROS: Patient denies CP, SOB, N/V/D   Objective:   No results found. Recent Labs    08/08/19 0534  WBC 6.1  HGB 10.8*  HCT 35.3*  PLT 224   Recent Labs    08/08/19 0534  NA 143  K 3.9  CL 108  CO2 27  GLUCOSE 121*  BUN 8  CREATININE 0.49  CALCIUM 8.5*    Intake/Output Summary (Last 24 hours) at 08/09/2019 1009 Last data filed at 08/08/2019 1920 Gross per 24 hour  Intake 770 ml  Output -  Net 770 ml     Physical Exam: Vital Signs Blood pressure 121/76, pulse (!) 106, temperature 98.4 F (36.9 C), temperature source Oral, resp. rate 18, height 5\' 7"  (1.702 m), weight 99.9 kg, SpO2 93 %.  Constitutional: No distress . Vital signs reviewed. HEENT: EOMI, oral membranes moist Neck: supple Cardiovascular: tachy without murmur. No JVD    Respiratory: CTA Bilaterally without wheezes or rales. Normal effort    GI: BS +, non-tender, non-distended  Extremities: No clubbing, cyanosis, or edema. Pulses are 2+ Skin: Clean and intact without signs of breakdown . Neuro: Pt is cognitively appropriate with normal insight, memory, and awareness. Cranial nerves 2-12 are intact. Sensory exam is normal. Reflexes are 1+ in all 4's. Fine motor coordination is intact. No tremors. Motor function is grossly 4/5 UE, 3+/5 HF, 3+/5 KE and 4+/5 ADF/PF. Mild steppage pattern to gait but improves with cueing.  Musculoskeletal: LB,LE pain with movement Left shoulder pain moderate with IR/ER/ABD. AC jt tender to palpation and with cross armed maneuver Psych: very pleasant   Assessment/Plan: 1. Functional deficits secondary to debility after  COVID which require 3+ hours per day of interdisciplinary therapy in a comprehensive inpatient rehab setting.  Physiatrist is providing close team supervision and 24 hour management of active medical problems listed below.  Physiatrist and rehab team continue to assess barriers to discharge/monitor patient progress toward functional and medical goals  Care Tool:  Bathing    Body parts bathed by patient: Right arm, Left arm, Chest, Abdomen, Face, Right upper leg, Left upper leg, Front perineal area, Buttocks   Body parts bathed by helper: Front perineal area, Buttocks, Right lower leg, Left lower leg     Bathing assist Assist Level: Contact Guard/Touching assist     Upper Body Dressing/Undressing Upper body dressing   What is the patient wearing?: Pull over shirt    Upper body assist Assist Level: Set up assist    Lower Body Dressing/Undressing Lower body dressing      What is the patient wearing?: Pants, Incontinence brief     Lower body assist Assist for lower body dressing: Minimal Assistance - Patient > 75%     Toileting Toileting    Toileting assist Assist for toileting: Minimal Assistance - Patient > 75%     Transfers Chair/bed transfer  Transfers assist  Chair/bed transfer activity did not occur: Safety/medical concerns  Chair/bed transfer assist level: Supervision/Verbal cueing(RW)     Locomotion Ambulation   Ambulation assist   Ambulation activity did not occur: Safety/medical concerns  Assist level: Supervision/Verbal cueing Assistive device: Walker-rolling Max  distance: 150 ft   Walk 10 feet activity   Assist  Walk 10 feet activity did not occur: Safety/medical concerns  Assist level: Supervision/Verbal cueing Assistive device: Walker-rolling   Walk 50 feet activity   Assist Walk 50 feet with 2 turns activity did not occur: Safety/medical concerns  Assist level: Supervision/Verbal cueing Assistive device: Walker-rolling    Walk  150 feet activity   Assist Walk 150 feet activity did not occur: Safety/medical concerns  Assist level: Supervision/Verbal cueing Assistive device: Walker-rolling    Walk 10 feet on uneven surface  activity   Assist Walk 10 feet on uneven surfaces activity did not occur: Safety/medical concerns         Wheelchair     Assist Will patient use wheelchair at discharge?: Yes Type of Wheelchair: Manual Wheelchair activity did not occur: Safety/medical concerns  Wheelchair assist level: Supervision/Verbal cueing Max wheelchair distance: 70 ft    Wheelchair 50 feet with 2 turns activity    Assist    Wheelchair 50 feet with 2 turns activity did not occur: Safety/medical concerns   Assist Level: Supervision/Verbal cueing   Wheelchair 150 feet activity     Assist  Wheelchair 150 feet activity did not occur: Safety/medical concerns   Assist Level: Supervision/Verbal cueing   Blood pressure 121/76, pulse (!) 106, temperature 98.4 F (36.9 C), temperature source Oral, resp. rate 18, height 5\' 7"  (1.702 m), weight 99.9 kg, SpO2 93 %.  Medical Problem List and Plan: 1. Impaired mobility and ADLs secondary to COVID-19  --Continue CIR therapies including PT, OT--progressing towards goals   2. Antithrombotics: -DVT/anticoagulation:Pharmaceutical:Lovenox -antiplatelet therapy: N/A 3.Chronic LBP/Pain Management:    heating pads 3 times per day PRN for low back pain. Continue lidocaine patch. Was on Oxycodone 15mg  PRN PTA.   11/27-for lbp/leg pain increased oxycodone to 5-10mg  q4 prn with good results   -tylenol prn  12/3: left shoulder pain-- mild adh cap, RTC, AC jt arthritis also   -modest relief with injection 12/2    -rom with therapies 4. Mood:LCSW to follow for evaluation and support. Team to provide ego support. -antipsychotic agents: N/A 5. Neuropsych: This patientiscapable of making decisions on herown behalf. 6.  Skin/Wound Care:Routine pressure relief measures.  -pinpoint anal lesion improving 7. Fluids/Electrolytes/Nutrition:encourage PO  -mild hypokalemia-continue supplement.  -persistent hypokalemia--resumed supplementation-     -potassium 3.9 12/4--continue supp 8. COPD: Has been on IV solumedrol --->prednisone tapered to off 9. T2DM with neuropathy: Continue Lantus 30 units daily.   -was on metformin xr at home pta (will not resume-discussed with her)  -began amaryl 1mg  daily on 11/20   -off steroids  -continue SSI for coverage   CBG (last 3)  Recent Labs    08/08/19 1622 08/08/19 2128 08/09/19 0645  GLUCAP 90 134* 123*  controlled 12/5 10. H/o depression: Monitor for now--not on any meds at this time. Seems very motivated 11. HTN: Monitor BP tid--currently on metoprolol 25mg  BID    Had one episode where her systolic was 87.  Will check orthostatic BPs, given that heart rate has been running around 100 on beta-blocker would like to continue this Vitals:   08/08/19 1932 08/09/19 0549  BP: 96/62 121/76  Pulse: 95 (!) 106  Resp: 18 18  Temp: 98.5 F (36.9 C) 98.4 F (36.9 C)  SpO2: 96% 93%   12. OSA: CPAP at nights with 2L oxygen bled in.  13. Insomnia: improved   -trazodone increased to home dose with improvement, 300mg  qhs 14. Anxiety:  Xanax 0.125mg  BID PRN. Was on 1mg  BID PTA.  15. HLD: Continue atorvastatin 10mg  HS. 16. Leukocytosis d/t steroids  -steroids tapered off  - wbc's 6.1 12/4   LOS: 18 days A FACE TO [place] E Kirsteins 08/09/2019, 10:09 AM

## 2019-08-10 ENCOUNTER — Inpatient Hospital Stay (HOSPITAL_COMMUNITY): Payer: Medicare Other

## 2019-08-10 DIAGNOSIS — R5381 Other malaise: Secondary | ICD-10-CM | POA: Diagnosis not present

## 2019-08-10 LAB — GLUCOSE, CAPILLARY
Glucose-Capillary: 118 mg/dL — ABNORMAL HIGH (ref 70–99)
Glucose-Capillary: 80 mg/dL (ref 70–99)
Glucose-Capillary: 89 mg/dL (ref 70–99)
Glucose-Capillary: 116 mg/dL — ABNORMAL HIGH (ref 70–99)

## 2019-08-10 NOTE — Progress Notes (Signed)
[place] PHYSICAL MEDICINE & REHABILITATION PROGRESS NOTE   Subjective/Complaints:  Patient states that she was using oxygen continuously at home prior to Covid.  She is not using it now.  She denies any shortness of breath or breathing issues.  She was taking inhalers at home  ROS: Patient denies CP, SOB, N/V/D   Objective:   No results found. Recent Labs    08/08/19 0534  WBC 6.1  HGB 10.8*  HCT 35.3*  PLT 224   Recent Labs    08/08/19 0534  NA 143  K 3.9  CL 108  CO2 27  GLUCOSE 121*  BUN 8  CREATININE 0.49  CALCIUM 8.5*    Intake/Output Summary (Last 24 hours) at 08/10/2019 1158 Last data filed at 08/10/2019 0718 Gross per 24 hour  Intake 1282 ml  Output -  Net 1282 ml     Physical Exam: Vital Signs Blood pressure 130/76, pulse (!) 109, temperature 98.3 F (36.8 C), temperature source Oral, resp. rate 20, height 5\' 7"  (1.702 m), weight 99.9 kg, SpO2 94 %.  Constitutional: No distress . Vital signs reviewed. HEENT: EOMI, oral membranes moist Neck: supple Cardiovascular: tachy without murmur. No JVD    Respiratory: CTA Bilaterally without wheezes or rales. Normal effort    GI: BS +, non-tender, non-distended  Extremities: No clubbing, cyanosis, or edema. Pulses are 2+ Skin: Clean and intact without signs of breakdown . Neuro: Pt is cognitively appropriate with normal insight, memory, and awareness. Cranial nerves 2-12 are intact. Sensory exam is normal. Reflexes are 1+ in all 4's. Fine motor coordination is intact. No tremors. Motor function is grossly 4/5 UE, 3+/5 HF, 3+/5 KE and 4+/5 ADF/PF. Mild steppage pattern to gait but improves with cueing.  Musculoskeletal: LB,LE pain with movement Left shoulder pain moderate with IR/ER/ABD. AC jt tender to palpation and with cross armed maneuver Psych: very pleasant   Assessment/Plan: 1. Functional deficits secondary to debility after COVID which require 3+ hours per day of interdisciplinary therapy in a  comprehensive inpatient rehab setting.  Physiatrist is providing close team supervision and 24 hour management of active medical problems listed below.  Physiatrist and rehab team continue to assess barriers to discharge/monitor patient progress toward functional and medical goals  Care Tool:  Bathing    Body parts bathed by patient: Right arm, Left arm, Chest, Abdomen, Face, Right upper leg, Left upper leg, Front perineal area, Buttocks   Body parts bathed by helper: Front perineal area, Buttocks, Right lower leg, Left lower leg     Bathing assist Assist Level: Contact Guard/Touching assist     Upper Body Dressing/Undressing Upper body dressing   What is the patient wearing?: Pull over shirt    Upper body assist Assist Level: Set up assist    Lower Body Dressing/Undressing Lower body dressing      What is the patient wearing?: Pants, Incontinence brief     Lower body assist Assist for lower body dressing: Minimal Assistance - Patient > 75%     Toileting Toileting    Toileting assist Assist for toileting: Minimal Assistance - Patient > 75%     Transfers Chair/bed transfer  Transfers assist  Chair/bed transfer activity did not occur: Safety/medical concerns  Chair/bed transfer assist level: Supervision/Verbal cueing(RW)     Locomotion Ambulation   Ambulation assist   Ambulation activity did not occur: Safety/medical concerns  Assist level: Supervision/Verbal cueing Assistive device: Walker-rolling Max distance: 150 ft   Walk 10 feet activity  Assist  Walk 10 feet activity did not occur: Safety/medical concerns  Assist level: Supervision/Verbal cueing Assistive device: Walker-rolling   Walk 50 feet activity   Assist Walk 50 feet with 2 turns activity did not occur: Safety/medical concerns  Assist level: Supervision/Verbal cueing Assistive device: Walker-rolling    Walk 150 feet activity   Assist Walk 150 feet activity did not occur:  Safety/medical concerns  Assist level: Supervision/Verbal cueing Assistive device: Walker-rolling    Walk 10 feet on uneven surface  activity   Assist Walk 10 feet on uneven surfaces activity did not occur: Safety/medical concerns         Wheelchair     Assist Will patient use wheelchair at discharge?: Yes Type of Wheelchair: Manual Wheelchair activity did not occur: Safety/medical concerns  Wheelchair assist level: Supervision/Verbal cueing Max wheelchair distance: 70 ft    Wheelchair 50 feet with 2 turns activity    Assist    Wheelchair 50 feet with 2 turns activity did not occur: Safety/medical concerns   Assist Level: Supervision/Verbal cueing   Wheelchair 150 feet activity     Assist  Wheelchair 150 feet activity did not occur: Safety/medical concerns   Assist Level: Supervision/Verbal cueing   Blood pressure 130/76, pulse (!) 109, temperature 98.3 F (36.8 C), temperature source Oral, resp. rate 20, height 5\' 7"  (1.702 m), weight 99.9 kg, SpO2 94 %.  Medical Problem List and Plan: 1. Impaired mobility and ADLs secondary to COVID-19  --Continue CIR therapies including PT, OT--progressing towards goals   2. Antithrombotics: -DVT/anticoagulation:Pharmaceutical:Lovenox -antiplatelet therapy: N/A 3.Chronic LBP/Pain Management:    heating pads 3 times per day PRN for low back pain. Continue lidocaine patch. Was on Oxycodone 15mg  PRN PTA.   11/27-for lbp/leg pain increased oxycodone to 5-10mg  q4 prn with good results   -tylenol prn  12/3: left shoulder pain-- mild adh cap, RTC, AC jt arthritis also   -modest relief with injection 12/2    -rom with therapies 4. Mood:LCSW to follow for evaluation and support. Team to provide ego support. -antipsychotic agents: N/A 5. Neuropsych: This patientiscapable of making decisions on herown behalf. 6. Skin/Wound Care:Routine pressure relief measures.  -pinpoint anal lesion  improving 7. Fluids/Electrolytes/Nutrition:encourage PO  -mild hypokalemia-continue supplement.  -persistent hypokalemia--resumed supplementation-     -potassium 3.9 12/4--continue supp 8. COPD: Has been on IV solumedrol --->prednisone tapered to off 9. T2DM with neuropathy: Continue Lantus 30 units daily.   -was on metformin xr at home pta (will not resume-discussed with her)  -began amaryl 1mg  daily on 11/20   -off steroids  -continue SSI for coverage   CBG (last 3)  Recent Labs    08/09/19 2119 08/10/19 0603 08/10/19 1126  GLUCAP 94 118* 80  controlled 12/6 10. H/o depression: Monitor for now--not on any meds at this time. Seems very motivated 11. HTN: Monitor BP tid--currently on metoprolol 25mg  BID    Had one episode where her systolic was 87.  Will check orthostatic BPs, given that heart rate has been running around 100 on beta-blocker would like to continue this Vitals:   08/10/19 0532 08/10/19 0734  BP:  130/76  Pulse: 96 (!) 109  Resp:    Temp:    SpO2:    Blood pressure in normal range today 12. OSA: CPAP at nights with 2L oxygen bled in.  13. Insomnia: improved   -trazodone increased to home dose with improvement, 300mg  qhs 14. Anxiety: Xanax 0.125mg  BID PRN. Was on 1mg  BID PTA.  15. HLD:  Continue atorvastatin 10mg  HS. 16. Leukocytosis d/t steroids  -steroids tapered off  - wbc's 6.1 12/4   LOS: 19 days A FACE TO [place] E Kirsteins 08/10/2019, 11:58 AM

## 2019-08-10 NOTE — Progress Notes (Signed)
Occupational Therapy Session Note  Patient Details  Name: Vanessa Mack MRN: [id number] Date of Birth: [birth year]  Today's Date: 08/10/2019 OT Individual Time: [id number] OT Individual Time Calculation (min): 55 min    Short Term Goals: Week 3:  OT Short Term Goal 1 (Week 3): Pt will blow dry hair completely with only set-up a OT Short Term Goal 2 (Week 3): Pt will perform sit<>stand from raised commode with consistent Min A OT Short Term Goal 3 (Week 3): Pt will tolerate standing for 5 minutes within BADL tasks.  Skilled Therapeutic Interventions/Progress Updates:    Pt received supine with c/o soreness in B shoulders, requesting shower as pain intervention. Pt completed bed mobility to EOB with (S). HR assessed- 109 at rest. Pt used RW and completed ambulatory transfer into the bathroom with CGA. Pt able to transfer onto TTB with good safety awareness and CGA. HR after transfer 123 bpm. Pt cued for rest breaks throughout shower with close (S) provided as pt completed all bathing seated level with lateral leans. Pt HR following shower 128 bpm. Pt returned to w/c and SOB, visibly fatigued. Lidocane patches applied to B shoulders posteriorly for pain relief, as instructed by RN. Pt required extended rest break. Pt donned shirt with (S). Min A to don brief.  Pt able to don pants with CGA sit <> stand. HR now at 115 bpm after another rest break. Pt used RW to return to supine in bed. Pt set up with all needs and bed alarm set.  Therapy Documentation Precautions:  Precautions Precautions: Fall, Other (comment) Precaution Comments: monitor HR, SpO2 Restrictions Weight Bearing Restrictions: No   Therapy/Group: Individual Therapy  Curtis Sites 08/10/2019, 7:16 AM

## 2019-08-10 NOTE — Progress Notes (Signed)
Slept good. PRN Oxy ir & tylenol given at 2319, for C/O chronic back and knee pain. Complained of feeling dizzy on the way to [facility]. "I stood up too quick." Will get ortho vitals in AM. Vanessa Mack A

## 2019-08-11 ENCOUNTER — Inpatient Hospital Stay (HOSPITAL_COMMUNITY): Payer: Medicare Other | Admitting: Physical Therapy

## 2019-08-11 ENCOUNTER — Inpatient Hospital Stay (HOSPITAL_COMMUNITY): Payer: Medicare Other | Admitting: Occupational Therapy

## 2019-08-11 DIAGNOSIS — E119 Type 2 diabetes mellitus without complications: Secondary | ICD-10-CM | POA: Diagnosis not present

## 2019-08-11 DIAGNOSIS — R5381 Other malaise: Secondary | ICD-10-CM | POA: Diagnosis not present

## 2019-08-11 DIAGNOSIS — G479 Sleep disorder, unspecified: Secondary | ICD-10-CM | POA: Diagnosis not present

## 2019-08-11 DIAGNOSIS — E876 Hypokalemia: Secondary | ICD-10-CM | POA: Diagnosis not present

## 2019-08-11 LAB — CBC
HCT: 35.7 % — ABNORMAL LOW (ref 36.0–46.0)
Hemoglobin: 11.1 g/dL — ABNORMAL LOW (ref 12.0–15.0)
MCH: 28.9 pg (ref 26.0–34.0)
MCHC: 31.1 g/dL (ref 30.0–36.0)
MCV: 93 fL (ref 80.0–100.0)
Platelets: 233 10*3/uL (ref 150–400)
RBC: 3.84 MIL/uL — ABNORMAL LOW (ref 3.87–5.11)
RDW: 15.7 % — ABNORMAL HIGH (ref 11.5–15.5)
WBC: 7.6 10*3/uL (ref 4.0–10.5)
nRBC: 0 % (ref 0.0–0.2)

## 2019-08-11 LAB — BASIC METABOLIC PANEL
Anion gap: 10 (ref 5–15)
BUN: 9 mg/dL (ref 8–23)
CO2: 27 mmol/L (ref 22–32)
Calcium: 8.6 mg/dL — ABNORMAL LOW (ref 8.9–10.3)
Chloride: 106 mmol/L (ref 98–111)
Creatinine, Ser: 0.67 mg/dL (ref 0.44–1.00)
GFR calc Af Amer: >60 mL/min (ref >60)
GFR calc non Af Amer: >60 mL/min (ref >60)
Glucose, Bld: 135 mg/dL — ABNORMAL HIGH (ref 70–99)
Potassium: 4.2 mmol/L (ref 3.5–5.1)
Sodium: 143 mmol/L (ref 135–145)

## 2019-08-11 LAB — GLUCOSE, CAPILLARY
Glucose-Capillary: 138 mg/dL — ABNORMAL HIGH (ref 70–99)
Glucose-Capillary: 127 mg/dL — ABNORMAL HIGH (ref 70–99)
Glucose-Capillary: 97 mg/dL (ref 70–99)
Glucose-Capillary: 103 mg/dL — ABNORMAL HIGH (ref 70–99)
Glucose-Capillary: 95 mg/dL (ref 70–99)

## 2019-08-11 NOTE — Progress Notes (Signed)
Occupational Therapy Daily Progress Note  Patient Details  Name: Vanessa Mack MRN: [id number] Date of Birth: 12-Aug-1951  Today's Date: 08/11/2019 OT Individual Time: [id number] OT Individual Time Calculation (min): 73 min    Skilled Therapeutic Interventions/Progress Updates:    Pt greeted semi-reclined in bed and agreeable treatment session. Pt reported more pain in L shoulder, believes it is arthritic pain, but still tolerates moving shower. MD entered to assess pt. Pt came to sitting EOB w/ supervision, then ambulated into bathroom to toilet with RW and close supervision. CGA for balance when doffing pants. PT voided bladder and completed peri-care without assistance. Pt then ambulated to sit on tub bench w/ RW and verbal cues for safety when turning to sit on bench. Bathing completed using LH sponge for LB bathing with set-up A. Pt needed OT assistance to rinse out hair 2/2 fatigue and UE weakness. Pt took rest break, then ambulated out of shower with CGA and RW. Dressing completed with set-up and min A to manage socks today 2/2 fatigue. Worked on standing balance/endurance and UB strengthening with standing hair drying task. Pt tolerated standing for 4 minutes, but needed OT assist to dry hair for the last minute 2/2 UE fatigue. OT brought pt down to tub room and practiced tub bench transfer in simulated home environment. OT discussed energy conservation techniques and safe BADL participation. Pt returned to room in wc and left seated in wc with chair alarm on and call bell in reach.   Therapy Documentation Precautions:  Precautions Precautions: Fall, Other (comment) Precaution Comments: monitor HR, SpO2 Restrictions Weight Bearing Restrictions: No Pain: Pt reports pain in L shoulder. No number given, repositioned and heat applied for comfort.   Therapy/Group: Individual Therapy  Valma Cava 08/11/2019, 9:13 AM

## 2019-08-11 NOTE — Progress Notes (Signed)
Occupational Therapy Weekly Progress Note  Patient Details  Name: Vanessa Mack MRN: [id number] Date of Birth: Apr 08, 1951  Beginning of progress report period: July 23, 2019 End of progress report period: August 11, 2019     Patient has met 3 of 3 short term goals.  Pt has made great progress towards OT goals this week. Pt is at an overall CGA level for BADL tasks and functional ambulation. Pt continues to fatigue quickly, and needs frequent rest breaks, but is overall progressing very well towards her supervision goals. Continue current POC.   Patient continues to demonstrate the following deficits: muscle weakness, decreased cardiorespiratoy endurance and decreased standing balance and decreased balance strategies and therefore will continue to benefit from skilled OT intervention to enhance overall performance with BADL.  Patient progressing toward long term goals..  Continue plan of care.  OT Short Term Goals Week 3:  OT Short Term Goal 1 (Week 3): Pt will blow dry hair completely with only set-up a OT Short Term Goal 1 - Progress (Week 3): Met OT Short Term Goal 2 (Week 3): Pt will perform sit<>stand from raised commode with consistent Min A OT Short Term Goal 2 - Progress (Week 3): Met OT Short Term Goal 3 (Week 3): Pt will tolerate standing for 5 minutes within BADL tasks. OT Short Term Goal 3 - Progress (Week 3): Met Week 4:  OT Short Term Goal 1 (Week 4): LTG= STG 2/2 ELOS   Therapy/Group: Individual Therapy  Valma Cava 08/11/2019, 11:06 PM

## 2019-08-11 NOTE — Progress Notes (Signed)
[place] PHYSICAL MEDICINE & REHABILITATION PROGRESS NOTE   Subjective/Complaints:  No new issues. Left shoulder still sore. Trying to work through it.   ROS: Patient denies fever, rash, sore throat, blurred vision, nausea, vomiting, diarrhea, cough, shortness of breath or chest pain,  headache, or mood change.    Objective:   No results found. Recent Labs    08/11/19 0522  WBC 7.6  HGB 11.1*  HCT 35.7*  PLT 233   Recent Labs    08/11/19 0522  NA 143  K 4.2  CL 106  CO2 27  GLUCOSE 135*  BUN 9  CREATININE 0.67  CALCIUM 8.6*    Intake/Output Summary (Last 24 hours) at 08/11/2019 [id number] Last data filed at 08/10/2019 2300 Gross per 24 hour  Intake 840 ml  Output -  Net 840 ml     Physical Exam: Vital Signs Blood pressure 111/67, pulse 91, temperature 98.4 F (36.9 C), resp. rate 18, height 5\' 7"  (1.702 m), weight 99.9 kg, SpO2 94 %.  Constitutional: No distress . Vital signs reviewed. HEENT: EOMI, oral membranes moist Neck: supple Cardiovascular: RRR without murmur. No JVD    Respiratory: CTA Bilaterally without wheezes or rales. Normal effort    GI: BS +, non-tender, non-distended  Extremities: No clubbing, cyanosis. Tr LE edema. Pulses are 2+ Skin: Clean and intact without signs of breakdown . Neuro: Pt is cognitively appropriate with normal insight, memory, and awareness. Cranial nerves 2-12 are intact. Sensory exam is normal. Reflexes are 1+ in all 4's. Fine motor coordination is intact. No tremors. Motor function is grossly 4/5 UE, 3+/5 HF, 3+/5 KE and 4+/5 ADF/PF.  Musculoskeletal: LB,LE pain with movement Left shoulder pain moderate with IR/ER/ABD. AC jt still to palpation and with cross armed maneuver Psych: very pleasant   Assessment/Plan: 1. Functional deficits secondary to debility after COVID which require 3+ hours per day of interdisciplinary therapy in a comprehensive inpatient rehab setting.  Physiatrist is providing close team supervision and  24 hour management of active medical problems listed below.  Physiatrist and rehab team continue to assess barriers to discharge/monitor patient progress toward functional and medical goals  Care Tool:  Bathing    Body parts bathed by patient: Right arm, Left arm, Chest, Abdomen, Face, Right upper leg, Left upper leg, Front perineal area, Buttocks   Body parts bathed by helper: Front perineal area, Buttocks, Right lower leg, Left lower leg     Bathing assist Assist Level: Contact Guard/Touching assist     Upper Body Dressing/Undressing Upper body dressing   What is the patient wearing?: Pull over shirt    Upper body assist Assist Level: Set up assist    Lower Body Dressing/Undressing Lower body dressing      What is the patient wearing?: Pants, Incontinence brief     Lower body assist Assist for lower body dressing: Minimal Assistance - Patient > 75%     Toileting Toileting    Toileting assist Assist for toileting: Minimal Assistance - Patient > 75%     Transfers Chair/bed transfer  Transfers assist  Chair/bed transfer activity did not occur: Safety/medical concerns  Chair/bed transfer assist level: Supervision/Verbal cueing(RW)     Locomotion Ambulation   Ambulation assist   Ambulation activity did not occur: Safety/medical concerns  Assist level: Supervision/Verbal cueing Assistive device: Walker-rolling Max distance: 150 ft   Walk 10 feet activity   Assist  Walk 10 feet activity did not occur: Safety/medical concerns  Assist level: Supervision/Verbal cueing Assistive  device: Walker-rolling   Walk 50 feet activity   Assist Walk 50 feet with 2 turns activity did not occur: Safety/medical concerns  Assist level: Supervision/Verbal cueing Assistive device: Walker-rolling    Walk 150 feet activity   Assist Walk 150 feet activity did not occur: Safety/medical concerns  Assist level: Supervision/Verbal cueing Assistive device:  Walker-rolling    Walk 10 feet on uneven surface  activity   Assist Walk 10 feet on uneven surfaces activity did not occur: Safety/medical concerns         Wheelchair     Assist Will patient use wheelchair at discharge?: Yes Type of Wheelchair: Manual Wheelchair activity did not occur: Safety/medical concerns  Wheelchair assist level: Supervision/Verbal cueing Max wheelchair distance: 70 ft    Wheelchair 50 feet with 2 turns activity    Assist    Wheelchair 50 feet with 2 turns activity did not occur: Safety/medical concerns   Assist Level: Supervision/Verbal cueing   Wheelchair 150 feet activity     Assist  Wheelchair 150 feet activity did not occur: Safety/medical concerns   Assist Level: Supervision/Verbal cueing   Blood pressure 111/67, pulse 91, temperature 98.4 F (36.9 C), resp. rate 18, height 5\' 7"  (1.702 m), weight 99.9 kg, SpO2 94 %.  Medical Problem List and Plan: 1. Impaired mobility and ADLs secondary to COVID-19  --Continue CIR therapies including PT, OT--progressing towards goals   2. Antithrombotics: -DVT/anticoagulation:Pharmaceutical:Lovenox -antiplatelet therapy: N/A 3.Chronic LBP/Pain Management:    heating pads 3 times per day PRN for low back pain. Continue lidocaine patch. Was on Oxycodone 15mg  PRN PTA.   11/27-for lbp/leg pain increased oxycodone to 5-10mg  q4 prn with good results   -tylenol prn  12/7: left shoulder pain-- mild adh cap, RTC, AC jt arthritis also   -modest relief with injection 12/2    -continue rom with therapies   -consider AC jt injection 4. Mood:LCSW to follow for evaluation and support. Team to provide ego support. -antipsychotic agents: N/A 5. Neuropsych: This patientiscapable of making decisions on herown behalf. 6. Skin/Wound Care:Routine pressure relief measures.  -pinpoint anal lesion improving 7. Fluids/Electrolytes/Nutrition:encourage PO  -mild  hypokalemia-continue supplement.  -persistent hypokalemia--resumed supplementation-     -potassium 3.9 12/4--continue supp 8. COPD: Has been on IV solumedrol --->prednisone tapered to off 9. T2DM with neuropathy: Continue Lantus 30 units daily.   -was on metformin xr at home pta (will not resume-discussed with her)  -began amaryl 1mg  daily on 11/20   -off steroids  -continue SSI for coverage   CBG (last 3)  Recent Labs    08/10/19 1620 08/10/19 2121 08/11/19 0552  GLUCAP 89 116* 138*  controlled 12/7 10. H/o depression: Monitor for now--not on any meds at this time. Seems very motivated 11. HTN: Monitor BP tid--currently on metoprolol 25mg  BID    Had one episode where her systolic was 87.  Will check orthostatic BPs, given that heart rate has been running around 100 on beta-blocker would like to continue this Vitals:   08/11/19 0539 08/11/19 0638  BP: 98/82 111/67  Pulse: (!) 117 91  Resp: 18   Temp: 98.4 F (36.9 C)   SpO2: 94%   Blood pressure in normal range today 12/7 12. OSA: CPAP at nights with 2L oxygen bled in.  13. Insomnia: improved   -trazodone increased to home dose with improvement, 300mg  qhs 14. Anxiety: Xanax 0.125mg  BID PRN. Was on 1mg  BID PTA.  15. HLD: Continue atorvastatin 10mg  HS. 16. Leukocytosis d/t steroids  -steroids  tapered off  - wbc's 7.6 12/7   LOS: 20 days A FACE TO [place] 08/11/2019, 9:22 AM

## 2019-08-11 NOTE — Progress Notes (Signed)
Physical Therapy Session Note  Patient Details  Name: Vanessa Mack MRN: [id number] Date of Birth: 09/05/1950  Today's Date: 08/11/2019 PT Individual Time: 1100-1155 AND 1430-1525 PT Individual Time Calculation (min): 55 min AND 55 min   Short Term Goals: Week 4:  PT Short Term Goal 1 (Week 4): STG = LTG due to estimated d/c date.  Skilled Therapeutic Interventions/Progress Updates:   Session 1: Pt in w/c and agreeable to therapy. Pt reports ongoing L shoulder pain w/ activity, does not rate but premedicated. Pt reports she has used a R cane in the past for support w/ gait, agreeable to attempt w/ hopes of decreasing L shoulder pain w/ RW use. Ambulated 30' w/ min-mod assist using SPC and pt very unstable. Needed 2nd helper to bring w/c for rest break. Practiced gait w/o any AD to work on balance w/ gait, and this looked better than w/ SPC. Pt more stable and able to sufficiently utilize BUEs held out in front of her to help maintain balance. Ambulated 20' x2 and 40' x4 w/ min assist w/o AD. Verbal, visual, and tactile cues to correct excessive lateral trunk leans w/ hip hike to take steps. Mirror for visual feedback, however this increased her instability. Ambulated back to room w/ RW and close supervision. Verbal and visual cues for heel strike and controlled steps. Ended session in w/c and all needs in reach. Ice applied to L shoulder and R knee for pain relief.   Session 2:  Pt in supine and agreeable to therapy, pain 6/10 in L shoulder and R knee. Pt reports ice helped her shoulder pain after AM session. Supine>sit w/ supervision. Ambulated to/from day room w/ RW, supervision. Ongoing verbal and visual cues for heel-to-toe pattern and fluid gait. Worked on functional standing balance w/ standing to fold towels at table. Had pt reach almost to floor to pick up towels, able to do so w/ CGA and unilateral UE support on table. Educated pt on importance of UE support when reaching to floor as  well as having chair behind her in case she needed to sit w/ prolonged standing activity. Kinetron 3 min x4 in kinetron seat @ 50 cm/sec to work on BLE strengthening and endurance training. Ambulated back to room and performed toilet transfer w/ CGA. Ended session in supine, all needs in reach.   Therapy Documentation Precautions:  Precautions Precautions: Fall, Other (comment) Precaution Comments: monitor HR, SpO2 Restrictions Weight Bearing Restrictions: No Pain: Pain Assessment Pain Scale: 0-10 Pain Score: 6  Pain Type: Acute pain Pain Location: Shoulder Pain Orientation: Right;Left Pain Descriptors / Indicators: Aching;Throbbing Pain Frequency: Intermittent Pain Onset: Gradual Patients Stated Pain Goal: 3 Pain Intervention(s): Medication (See eMAR)  Therapy/Group: Individual Therapy  Judyann Casasola Clent Demark 08/11/2019, 12:32 PM

## 2019-08-11 NOTE — Progress Notes (Signed)
Mild pitting edema to BLE's, patient reports sitting up in [facility] most of day. She seems a little more subdued this weekend. "I'm okay." Continues with Pinpoint area at rectum, no drainage, some discomfort when assessing. At 2110, complained of "heavy breathing", "this could be the start of a panic attack." Vitals checked. Seemed relieved after vitals, came back ok.  Declined need for inhaler. PRN oxy IR 10mg 's given at 2109(lower back pain) and 0418 (bilateral shoulder pain). Slept good until 0400. Vanessa Mack A

## 2019-08-12 ENCOUNTER — Inpatient Hospital Stay (HOSPITAL_COMMUNITY): Payer: Medicare Other | Admitting: Physical Therapy

## 2019-08-12 ENCOUNTER — Inpatient Hospital Stay (HOSPITAL_COMMUNITY): Payer: Medicare Other | Admitting: Occupational Therapy

## 2019-08-12 DIAGNOSIS — R5381 Other malaise: Secondary | ICD-10-CM | POA: Diagnosis not present

## 2019-08-12 DIAGNOSIS — E119 Type 2 diabetes mellitus without complications: Secondary | ICD-10-CM | POA: Diagnosis not present

## 2019-08-12 DIAGNOSIS — G479 Sleep disorder, unspecified: Secondary | ICD-10-CM | POA: Diagnosis not present

## 2019-08-12 DIAGNOSIS — E876 Hypokalemia: Secondary | ICD-10-CM | POA: Diagnosis not present

## 2019-08-12 LAB — GLUCOSE, CAPILLARY
Glucose-Capillary: 120 mg/dL — ABNORMAL HIGH (ref 70–99)
Glucose-Capillary: 130 mg/dL — ABNORMAL HIGH (ref 70–99)
Glucose-Capillary: 86 mg/dL (ref 70–99)
Glucose-Capillary: 106 mg/dL — ABNORMAL HIGH (ref 70–99)

## 2019-08-12 MED ORDER — LIVING WELL WITH DIABETES BOOK
Freq: Once | Status: AC
  Administered 2019-08-12: 13:00:00
  Filled 2019-08-12: qty 1

## 2019-08-12 MED ORDER — BLOOD PRESSURE CONTROL BOOK
Freq: Once | Status: AC
  Administered 2019-08-12: 13:00:00
  Filled 2019-08-12: qty 1

## 2019-08-12 NOTE — Progress Notes (Signed)
Occupational Therapy Session Note  Patient Details  Name: Vanessa Mack MRN: [id number] Date of Birth: [birth year]  Today's Date: 08/12/2019 OT Individual Time: 0930-1030 OT Individual Time Calculation (min): 60 min   Short Term Goals: Week 4:  OT Short Term Goal 1 (Week 4): LTG= STG 2/2 ELOS  Skilled Therapeutic Interventions/Progress Updates:    Pt greeted seated in wc and agreeable to OT treatment session. Pt ambulated into bathroom w/ RW and close supervision. Pt doffed clothing from tub bench. Bathing completed with set-up A overall. Pt ambulated out of shower with close supervision and RW. Pt had already pulled out her clothing from dresser at wc level. Dressing completed with overall set-up A, but min A to pull up socks. Pt took rest break, then OT propelled pt to day room for time management. Worked on L shoulder pain with gentle ROM using towel pushes. Pt returned to room and left seated in wc with chair alarm on and call bell in reach. Nursing to administer pain medications.   Therapy Documentation Precautions:  Precautions Precautions: Fall, Other (comment) Precaution Comments: monitor HR, SpO2 Restrictions Weight Bearing Restrictions: No Pain: Pain Assessment Pain Scale: 0-10 Pain Score: 8  Pain Location: Shoulder Pain Orientation: Left Pain Descriptors / Indicators: Aching Patients Stated Pain Goal: 4 Pain Intervention(s): Repositioned, nursing notified   Therapy/Group: Individual Therapy  Valma Cava 08/12/2019, 10:41 AM

## 2019-08-12 NOTE — Patient Care Conference (Signed)
Inpatient RehabilitationTeam Conference and Plan of Care Update Date: 08/12/2019   Time: 10:30 AM   Patient Name: Vanessa Mack      Medical Record Number: [id number]  Date of Birth: May 06, 1951 Sex: Female         Room/Bed: 4W02C/4W02C-01 Payor Info: Payor: MEDICARE / Plan: MEDICARE PART A AND B / Product Type: *No Product type* /    Admit Date/Time:  07/22/2019  1:50 PM  Primary Diagnosis:  Physical debility  Patient Active Problem List   Diagnosis Date Noted  . Physical debility 07/22/2019    Expected Discharge Date: Expected Discharge Date: 08/15/19  Team Members Present: Physician leading conference: Dr. Alger Simons Social Worker Present: Lennart Pall, LCSW Nurse Present: Dorien Chihuahua, RN Case Manager: Karene Fry, RN PT Present: Lavone Nian, PT OT Present: Cherylynn Ridges, OT SLP Present: Weston Anna, SLP PPS Coordinator present : Gunnar Fusi, SLP     Current Status/Progress Goal Weekly Team Focus  Bowel/Bladder   Pt is continent of B/B, LBM 08/11/19  remain continent of b/b  Q2h/ PRN toileing   Swallow/Nutrition/ Hydration             ADL's   CGA/supervision overall  Supervision  activity tolerance, self-care retraining, UB strengthening, pt/family eduction, dc planning   Mobility   supervision bed mobility, supervision gait with RW, supervision transfers, min assist stairs with B rails  supervision overall  transfers, standing tolerance, endurance, bed mobility, gait, stairs, strengthening, balance   Communication             Safety/Cognition/ Behavioral Observations            Pain   Pt c/o of genrealized pain, increased pain w/ R shoulder. Oxy and tylenol helps reduce the pain.  pain will be <3  Assess pain qshift/PRN, administer medications as needed   Skin   Sm red area to bottom, red in color, denies pain.  No further skin breakdown and free of infection.  Assess skin qhift/prn    Rehab Goals Patient on target to meet rehab goals:  Yes *See Care Plan and progress notes for long and short-term goals.     Barriers to Discharge  Current Status/Progress Possible Resolutions Date Resolved   Nursing                  PT                    OT                  SLP                SW                Discharge Planning/Teaching Needs:  Pt home with adult sons providing needed support.  Teaching to be planned.   Team Discussion: Shoulder issues, adjusted pain meds, BS controlled.  RN - tachycardic at times, cont B/B, oxy q4h for shoulder pain, has a knot in L epigastic area.  OT S ADLs.  PT S overall, min A stairs, amb 150', fatigues quickly.   Revisions to Treatment Plan: N/A     Medical Summary Current Status: improving stamina. pain an issues, especially left shoulder.--no results with injection, working on alternative methods of pain control Weekly Focus/Goal: see above  Barriers to Discharge: Medical stability   Possible Resolutions to Barriers: see medical progress notes   Continued Need for Acute Rehabilitation Level of Care: The patient  requires daily medical management by a physician with specialized training in physical medicine and rehabilitation for the following reasons: Direction of a multidisciplinary physical rehabilitation program to maximize functional independence : Yes Medical management of patient stability for increased activity during participation in an intensive rehabilitation regime.: Yes Analysis of laboratory values and/or radiology reports with any subsequent need for medication adjustment and/or medical intervention. : Yes   I attest that I was present, lead the team conference, and concur with the assessment and plan of the team.   Retta Diones 08/12/2019, 3:10 PM  Team conference was held via web/ teleconference due to [place] - 19

## 2019-08-12 NOTE — Progress Notes (Signed)
[place] PHYSICAL MEDICINE & REHABILITATION PROGRESS NOTE   Subjective/Complaints: Overall feeling well. Working through shoulder pain.   ROS: Patient denies fever, rash, sore throat, blurred vision, nausea, vomiting, diarrhea, cough, shortness of breath or chest pain, , headache, or mood change.    Objective:   No results found. Recent Labs    08/11/19 0522  WBC 7.6  HGB 11.1*  HCT 35.7*  PLT 233   Recent Labs    08/11/19 0522  NA 143  K 4.2  CL 106  CO2 27  GLUCOSE 135*  BUN 9  CREATININE 0.67  CALCIUM 8.6*    Intake/Output Summary (Last 24 hours) at 08/12/2019 0918 Last data filed at 08/12/2019 0900 Gross per 24 hour  Intake 840 ml  Output -  Net 840 ml     Physical Exam: Vital Signs Blood pressure (!) 101/56, pulse (!) 101, temperature 98.7 F (37.1 C), temperature source Oral, resp. rate 16, height 5\' 7"  (1.702 m), weight 99.9 kg, SpO2 96 %.  Constitutional: No distress . Vital signs reviewed. HEENT: EOMI, oral membranes moist Neck: supple Cardiovascular: RRR without murmur. No JVD    Respiratory: CTA Bilaterally without wheezes or rales. Normal effort    GI: BS +, non-tender, non-distended  Extremities: No clubbing, cyanosis. Tr LE edema. Pulses are 2+ Skin: Clean and intact without signs of breakdown . Neuro: Pt is cognitively appropriate with normal insight, memory, and awareness. Cranial nerves 2-12 are intact. Sensory exam is normal. Reflexes are 1+ in all 4's. Fine motor coordination is intact. No tremors. Motor function is grossly 4/5 UE, 3+/5 HF, 3+/5 KE and 4+/5 ADF/PF.  Musculoskeletal: LB,LE pain with movement Left shoulder tender with IR/ER, impingement maneuver, pain at [facility] jt too Psych: pleasant as always   Assessment/Plan: 1. Functional deficits secondary to debility after COVID which require 3+ hours per day of interdisciplinary therapy in a comprehensive inpatient rehab setting.  Physiatrist is providing close team supervision and 24  hour management of active medical problems listed below.  Physiatrist and rehab team continue to assess barriers to discharge/monitor patient progress toward functional and medical goals  Care Tool:  Bathing    Body parts bathed by patient: Right arm, Left arm, Chest, Abdomen, Face, Right upper leg, Left upper leg, Front perineal area, Buttocks   Body parts bathed by helper: Front perineal area, Buttocks, Right lower leg, Left lower leg     Bathing assist Assist Level: Contact Guard/Touching assist     Upper Body Dressing/Undressing Upper body dressing   What is the patient wearing?: Pull over shirt    Upper body assist Assist Level: Set up assist    Lower Body Dressing/Undressing Lower body dressing      What is the patient wearing?: Pants, Incontinence brief     Lower body assist Assist for lower body dressing: Minimal Assistance - Patient > 75%     Toileting Toileting    Toileting assist Assist for toileting: Minimal Assistance - Patient > 75%     Transfers Chair/bed transfer  Transfers assist  Chair/bed transfer activity did not occur: Safety/medical concerns  Chair/bed transfer assist level: Supervision/Verbal cueing(RW)     Locomotion Ambulation   Ambulation assist   Ambulation activity did not occur: Safety/medical concerns  Assist level: Supervision/Verbal cueing Assistive device: Walker-rolling Max distance: 150'   Walk 10 feet activity   Assist  Walk 10 feet activity did not occur: Safety/medical concerns  Assist level: Supervision/Verbal cueing Assistive device: Walker-rolling   Walk  50 feet activity   Assist Walk 50 feet with 2 turns activity did not occur: Safety/medical concerns  Assist level: Supervision/Verbal cueing Assistive device: Walker-rolling    Walk 150 feet activity   Assist Walk 150 feet activity did not occur: Safety/medical concerns  Assist level: Supervision/Verbal cueing Assistive device: Walker-rolling     Walk 10 feet on uneven surface  activity   Assist Walk 10 feet on uneven surfaces activity did not occur: Safety/medical concerns         Wheelchair     Assist Will patient use wheelchair at discharge?: Yes Type of Wheelchair: Manual Wheelchair activity did not occur: Safety/medical concerns  Wheelchair assist level: Supervision/Verbal cueing Max wheelchair distance: 100 ft    Wheelchair 50 feet with 2 turns activity    Assist    Wheelchair 50 feet with 2 turns activity did not occur: Safety/medical concerns   Assist Level: Supervision/Verbal cueing   Wheelchair 150 feet activity     Assist  Wheelchair 150 feet activity did not occur: Safety/medical concerns   Assist Level: Supervision/Verbal cueing   Blood pressure (!) 101/56, pulse (!) 101, temperature 98.7 F (37.1 C), temperature source Oral, resp. rate 16, height 5\' 7"  (1.702 m), weight 99.9 kg, SpO2 96 %.  Medical Problem List and Plan: 1. Impaired mobility and ADLs secondary to COVID-19  --Continue CIR therapies including PT, OT--progressing towards goals    -team conference today  -ELOS 08/20/2019 2. Antithrombotics: -DVT/anticoagulation:Pharmaceutical:Lovenox -antiplatelet therapy: N/A 3.Chronic LBP/Pain Management:    heating pads 3 times per day PRN for low back pain. Continue lidocaine patch. Was on Oxycodone 15mg  PRN PTA.   11/27-for lbp/leg pain increased oxycodone to 5-10mg  q4 prn with good results   -tylenol prn  12/7: left shoulder pain-- mild adh cap, RTC, AC jt arthritis also   -modest relief with injection 12/2    -continue rom with therapies,K-tape   -consider AC jt injection   -add sports cream to shoulder 4. Mood:LCSW to follow for evaluation and support. Team to provide ego support. -antipsychotic agents: N/A 5. Neuropsych: This patientiscapable of making decisions on herown behalf. 6. Skin/Wound Care:Routine pressure relief  measures.  -pinpoint anal lesion improving 7. Fluids/Electrolytes/Nutrition:encourage PO  -mild hypokalemia-continue supplement.  -persistent hypokalemia--resumed supplementation-     -potassium 3.9 12/4--continue supp 8. COPD: Has been on IV solumedrol --->prednisone tapered to off 9. T2DM with neuropathy: Continue Lantus 30 units daily.   -was on metformin xr at home pta (will not resume-discussed with her)  -began amaryl 1mg  daily on 11/20   -off steroids  -continue SSI for coverage   CBG (last 3)  Recent Labs    08/11/19 1732 08/11/19 2105 08/12/19 0633  GLUCAP 103* 95 120*  controlled 12/8 10. H/o depression: Monitor for now--not on any meds at this time. Seems very motivated 11. HTN: Monitor BP tid--currently on metoprolol 25mg  BID    Had one episode where her systolic was 87.  Will check orthostatic BPs, given that heart rate has been running around 100 on beta-blocker would like to continue this Vitals:   08/12/19 0415 08/12/19 0903  BP: 106/75 (!) 101/56  Pulse: (!) 132 (!) 101  Resp:  16  Temp:  98.7 F (37.1 C)  SpO2: 96% 96%  Blood pressure in normal range today 12/7 12. OSA: CPAP at nights with 2L oxygen bled in.  13. Insomnia: improved   -trazodone increased to home dose with improvement, 300mg  qhs 14. Anxiety: Xanax 0.125mg  BID PRN. Was on  1mg  BID PTA.  15. HLD: Continue atorvastatin 10mg  HS. 16. Leukocytosis d/t steroids  -steroids tapered off  - wbc's 7.6 12/7   LOS: 21 days A FACE TO FACE EVALUATION WAS PERFORMED  Meredith Staggers 08/12/2019, 9:18 AM

## 2019-08-12 NOTE — Progress Notes (Signed)
Physical Therapy Session Note  Patient Details  Name: Vanessa Mack MRN: [id number] Date of Birth: 1950/11/04  Today's Date: 08/12/2019 PT Individual Time: [phone] and [id number] PT Individual Time Calculation (min): 54 min and 69 min  Short Term Goals: Week 4:  PT Short Term Goal 1 (Week 4): STG = LTG due to estimated d/c date.  Skilled Therapeutic Interventions/Progress Updates:  Treatment 1: Pt received in bed & agreeable to tx. Pt reports her bed height is 3.5 ft at home so hospital bed placed at 40" to stimulate her bed at home - asked pt to have son's measure bed height again 2/2 significantly elevated height so we can practice this this afternoon & pt agreeable. Pt transfers supine>sit with supervision & hospital bed features and sit<>stand with supervision. Pt ambulates room>dayroom with RW & supervision with decreased gait speed. Pt utilized nu-step on level 4 x 10 minutes with all four extremities with task focusing on global strengthening & endurance training; pt requires 1 rest break 2/2 fatigue. Pt them ambulates dayroom>past 2nd gym doors before requiring rest break. Transported pt to ortho gym where she completes car transfer SUV simulated height with RW & supervision with cuing to sit then place BLE in/out vs stepping into SUV. Pt propels w/c ortho gym>elevators with all four extremities with supervision before requiring rest break. At end of session pt left in w/c with chair alarm donned, NT in room.  Pain: pt reports chronic pain in L shoulder, back, knees, neck - does not rate - rest breaks provided PRN & pt reports she's premedicated.   Treatment 2: Pt received in bed with RN present administering meds (pt also received pain meds) - pt reports 6/10 chronic pain in neck, shoulders, back & knees - rest breaks also provided PRN. Pt reports her bed height is 30" after clarifying with her sons. NT present & assessing vitals, see below:  Supine: BP = 95/43 mmHg Sitting EOB: BP =  115/72 mmHg Standing at 0 minutes: BP = 96/62 mmHg Pt only reports fatigue & slight HA, but no other c/o adverse symptoms.   Set pt's bed height at 30" to simulate pt's bed at home. Pt completes sit<>supine & rolling L<>R with bed flat, no rails with supervision. Pt ambulates in room/bathroom with RW & supervision, completes clothing management & toilet transfer with supervision, & pt with continent void on toilet. Pt performed hand hygiene standing at sink with supervision. Transported pt to dayroom via w/c dependent assist for time management. Provided pt with HEP & pt performs the following exercises with instructional cuing: seated long arc quads with 5 second hold, standing hip abduction, standing hamstring curls, mini squats, & toe (pt able to perform 1 LE at a time) & heel raises for BLE strengthening with BUE support on high/low table. Pt ambulates dayroom>room with RW & supervision. At end of session pt left in bed with alarm set & all needs at hand.   Therapy Documentation Precautions:  Precautions Precautions: Fall, Other (comment) Precaution Comments: monitor HR, SpO2 Restrictions Weight Bearing Restrictions: No   Therapy/Group: Individual Therapy  Waunita Schooner 08/12/2019, 3:40 PM

## 2019-08-12 NOTE — Progress Notes (Signed)
  Patient ID: AMANDA-JANE CORNELLIER, female   DOB: 1951/08/07, 68 y.o.   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  A walker will not resolve issue with performing activities of daily living.  A wheelchair will allow patient to safely perform daily activities.  Patient is not able to propel themselves in the home using a standard weight wheelchair due to severe weakness.  Patient can self propel in the lightweight wheelchair.  Lauraine Rinne, PA-C

## 2019-08-13 ENCOUNTER — Inpatient Hospital Stay (HOSPITAL_COMMUNITY): Payer: Medicare Other

## 2019-08-13 ENCOUNTER — Inpatient Hospital Stay (HOSPITAL_COMMUNITY): Payer: Medicare Other | Admitting: Physical Therapy

## 2019-08-13 DIAGNOSIS — R5381 Other malaise: Secondary | ICD-10-CM | POA: Diagnosis not present

## 2019-08-13 LAB — GLUCOSE, CAPILLARY
Glucose-Capillary: 106 mg/dL — ABNORMAL HIGH (ref 70–99)
Glucose-Capillary: 130 mg/dL — ABNORMAL HIGH (ref 70–99)
Glucose-Capillary: 127 mg/dL — ABNORMAL HIGH (ref 70–99)
Glucose-Capillary: 94 mg/dL (ref 70–99)

## 2019-08-13 NOTE — Progress Notes (Signed)
[facility] PHYSICAL MEDICINE & REHABILITATION PROGRESS NOTE   Subjective/Complaints: Overall feeling well. Working through shoulder pain. Had some pain relief 1-2 days following corticosteroid injection and then pain returned. It is worst when she is using her RW. Has lidocaine patches for her bilateral shoulders. Very appreciative of her care here.  Using walker, she was able to walk to therapy gym yesterday and is very pleased with her progress.   ROS: Patient denies fever, rash, sore throat, blurred vision, nausea, vomiting, diarrhea, cough, shortness of breath or chest pain, , headache, or mood change.    Objective:   No results found. Recent Labs    08/11/19 0522  WBC 7.6  HGB 11.1*  HCT 35.7*  PLT 233   Recent Labs    08/11/19 0522  NA 143  K 4.2  CL 106  CO2 27  GLUCOSE 135*  BUN 9  CREATININE 0.67  CALCIUM 8.6*    Intake/Output Summary (Last 24 hours) at 08/13/2019 0956 Last data filed at 08/13/2019 0900 Gross per 24 hour  Intake 660 ml  Output -  Net 660 ml     Physical Exam: Vital Signs Blood pressure 120/67, pulse (!) 127, temperature 98.5 F (36.9 C), temperature source Oral, resp. rate 18, height 5\' 7"  (1.702 m), weight 99.9 kg, SpO2 95 %.  Constitutional: No distress . Vital signs reviewed. Lying in bed.  HEENT: EOMI, oral membranes moist Neck: supple Cardiovascular: RRR without murmur. No JVD    Respiratory: CTA Bilaterally without wheezes or rales. Normal effort    GI: BS +, non-tender, non-distended  Extremities: No clubbing, cyanosis. Tr LE edema. Pulses are 2+ Skin: Clean and intact without signs of breakdown . Neuro: Pt is cognitively appropriate with normal insight, memory, and awareness. Cranial nerves 2-12 are intact. Sensory exam is normal. Reflexes are 1+ in all 4's. Fine motor coordination is intact. No tremors. Motor function is grossly 4/5 UE, 3+/5 HF, 3+/5 KE and 4+/5 ADF/PF.  Musculoskeletal: LB,LE pain with movement Left shoulder  tender with IR/ER, impingement maneuver, pain at [facility] jt too Psych: pleasant as always   Assessment/Plan: 1. Functional deficits secondary to debility after COVID which require 3+ hours per day of interdisciplinary therapy in a comprehensive inpatient rehab setting.  Physiatrist is providing close team supervision and 24 hour management of active medical problems listed below.  Physiatrist and rehab team continue to assess barriers to discharge/monitor patient progress toward functional and medical goals  Care Tool:  Bathing    Body parts bathed by patient: Right arm, Left arm, Chest, Abdomen, Face, Right upper leg, Left upper leg, Front perineal area, Buttocks   Body parts bathed by helper: Front perineal area, Buttocks, Right lower leg, Left lower leg     Bathing assist Assist Level: Contact Guard/Touching assist     Upper Body Dressing/Undressing Upper body dressing   What is the patient wearing?: Pull over shirt    Upper body assist Assist Level: Set up assist    Lower Body Dressing/Undressing Lower body dressing      What is the patient wearing?: Pants, Incontinence brief     Lower body assist Assist for lower body dressing: Minimal Assistance - Patient > 75%     Toileting Toileting    Toileting assist Assist for toileting: Minimal Assistance - Patient > 75%     Transfers Chair/bed transfer  Transfers assist  Chair/bed transfer activity did not occur: Safety/medical concerns  Chair/bed transfer assist level: Supervision/Verbal cueing  Locomotion Ambulation   Ambulation assist   Ambulation activity did not occur: Safety/medical concerns  Assist level: Supervision/Verbal cueing Assistive device: Walker-rolling Max distance: 150'   Walk 10 feet activity   Assist  Walk 10 feet activity did not occur: Safety/medical concerns  Assist level: Supervision/Verbal cueing Assistive device: Walker-rolling   Walk 50 feet activity   Assist Walk 50  feet with 2 turns activity did not occur: Safety/medical concerns  Assist level: Supervision/Verbal cueing Assistive device: Walker-rolling    Walk 150 feet activity   Assist Walk 150 feet activity did not occur: Safety/medical concerns  Assist level: Supervision/Verbal cueing Assistive device: Walker-rolling    Walk 10 feet on uneven surface  activity   Assist Walk 10 feet on uneven surfaces activity did not occur: Safety/medical concerns         Wheelchair     Assist Will patient use wheelchair at discharge?: Yes Type of Wheelchair: Manual Wheelchair activity did not occur: Safety/medical concerns  Wheelchair assist level: Supervision/Verbal cueing Max wheelchair distance: 100 ft    Wheelchair 50 feet with 2 turns activity    Assist    Wheelchair 50 feet with 2 turns activity did not occur: Safety/medical concerns   Assist Level: Supervision/Verbal cueing   Wheelchair 150 feet activity     Assist  Wheelchair 150 feet activity did not occur: Safety/medical concerns   Assist Level: Supervision/Verbal cueing   Blood pressure 120/67, pulse (!) 127, temperature 98.5 F (36.9 C), temperature source Oral, resp. rate 18, height 5\' 7"  (1.702 m), weight 99.9 kg, SpO2 95 %.  Medical Problem List and Plan: 1. Impaired mobility and ADLs secondary to COVID-19  --Continue CIR therapies including PT, OT--progressing towards goals    -team conference today  -ELOS 08/20/2019 2. Antithrombotics: -DVT/anticoagulation:Pharmaceutical:Lovenox -antiplatelet therapy: N/A 3.Chronic LBP/Pain Management:    heating pads 3 times per day PRN for low back pain. Continue lidocaine patch. Was on Oxycodone 15mg  PRN PTA.   11/27-for lbp/leg pain increased oxycodone to 5-10mg  q4 prn with good results   -tylenol prn  12/7: left shoulder pain-- mild adh cap, RTC, AC jt arthritis also   -modest relief with injection 12/2    -continue rom with  therapies,K-tape   -consider AC jt injection   -add sports cream to shoulder 4. Mood:LCSW to follow for evaluation and support. Team to provide ego support. -antipsychotic agents: N/A 5. Neuropsych: This patientiscapable of making decisions on herown behalf. 6. Skin/Wound Care:Routine pressure relief measures.  -pinpoint anal lesion improving 7. Fluids/Electrolytes/Nutrition:encourage PO  -mild hypokalemia-continue supplement.  -persistent hypokalemia--resumed supplementation-     -potassium 3.9 12/4--continue supp 8. COPD: Has been on IV solumedrol --->prednisone tapered to off 9. T2DM with neuropathy: Continue Lantus 30 units daily.   -was on metformin xr at home pta (will not resume-discussed with her)  -began amaryl 1mg  daily on 11/20   -off steroids  -continue SSI for coverage CBG (last 3)  Recent Labs    08/12/19 1706 08/12/19 2055 08/13/19 0638  GLUCAP 86 106* 106*  controlled 12/8 10. H/o depression: Monitor for now--not on any meds at this time. Seems very motivated 11. HTN: Monitor BP tid--currently on metoprolol 25mg  BID    Had one episode where her systolic was 87.  Will check orthostatic BPs, given that heart rate has been running around 100 on beta-blocker would like to continue this Vitals:   08/13/19 0454 08/13/19 0455  BP: 109/74 120/67  Pulse: (!) 106 (!) 127  Resp: 18  Temp:    SpO2: 94% 95%  Blood pressure in normal range today 12/7 BP stable 12/9, but tachycardic to 127. Asymptomatic. Will maintain current lopressor dose 25mg  BID 12. OSA: CPAP at nights with 2L oxygen bled in.  13. Insomnia: improved   -trazodone increased to home dose with improvement, 300mg  qhs 14. Anxiety: Xanax 0.125mg  BID PRN. Was on 1mg  BID PTA.  15. HLD: Continue atorvastatin 10mg  HS. 16. Leukocytosis d/t steroids  -steroids tapered off  - wbc's 7.6 12/7   LOS: 22 days A FACE TO [place] 08/13/2019, 9:56 AM

## 2019-08-13 NOTE — Progress Notes (Signed)
Physical Therapy Discharge Summary  Patient Details  Name: Vanessa Mack MRN: [id number] Date of Birth: [birth year]  Patient has met 6 of 6 long term goals due to improved activity tolerance, improved balance, improved postural control, increased strength, ability to compensate for deficits and functional use of  right lower extremity and left lower extremity.  Patient to discharge at an ambulatory level Supervision for household mobility. Patient's care partner is independent to provide the necessary physical assistance at discharge. Pt can direct her care well.   Reasons goals not met: n/a  Recommendation:  Patient will benefit from ongoing skilled PT services in home health setting to continue to advance safe functional mobility, address ongoing impairments in global strength, endurance, and functional balance, and minimize fall risk.  Pt provided w/ written HEP for BLE strengthening upon d/c.   Equipment: 20x18 w/c for community mobility, already has RW at home  Reasons for discharge: treatment goals met and discharge from hospital  Patient/family agrees with progress made and goals achieved: Yes  PT Discharge 0800-0904, 64 min individual tx  tx 1:  Pt in bed with RN dispensing meds.  Pt stated R knee and L shoulder pain 5/10.  Supine> sit with superivsion.  Pt donned shoes when they were placed in front of her.  Sit> stand to RW .  Gait training in room to toilet, .  Toilet transfer and clothing mgt with supervision.  Hand washing at sink in standing, supervision.  Gait training with RW to gym, supervision, 200'.  neuromuscular re-education via forced use, demo and multimodal cues for balance challenge and sustained stretch hamstrings and heel cords, x 4 minutes, with bil UE support> 0UE support, with mild swaying but no LOB.  In sitting, bil scapular retraction x 15.  Therapeutic activity in sitting for trunk activation for rotators: reaching L/R out of BOS laterally with  ipsilateral hand to grasp itmes, and rotating to place on contralateral side , x 7 each.  Cues for trunk rotation rather than trunk extension.  Gait up/down ramp with RW, supervision;  over mulched area with CGA;  and in /out of simulated car at SUV height with supervision. PT instructed pt in seated self -stretching hamstrings and heel cord  using foot stool, RLE x 30 seconds x 3 with cues for accuracy.   At end of session, pt seated in w/c with needs at hand, stretching LLE as above.   1535-1605 30 min individual tx tx 2:  Pt seated in w/c.  She rated pain diffuse throughout body 5/10; awaiting pain meds at end of session.  Gait training as above, with supervision.  Reviewed self stretching R/L hamstrings and issued hand-out.  Gait up/down (4) 6" high steps, bil rails, with close supervision and cues initially for sequencing.    Standing balance activity, reaching out of BOS with R hand for cards , and placing then on matching card on board in front of her.  No LOB during 9/9 reaches; accurate 100%.   Gait training to return to room, iwht RW, kicking Yoga block with R/L feet, CGA.  No LOB and improving excursion of R/L LLs for kicking, with practice.   At end of session, pt in w/c with needs at hand.  PT discussed falls precautions iwht pt; she voiced that she will not get up without assistance.   Precautions/Restrictions Precautions Precautions: Fall Restrictions Weight Bearing Restrictions: No Vital Signs Therapy Vitals Temp: 98 F (36.7 C) Pulse Rate: 94 Resp: 19 BP: 117/69  Patient Position (if appropriate): Sitting Oxygen Therapy SpO2: 96 % O2 Device: Room Air Pain Pain Assessment Pain Scale: 0-10 Pain Score: 4  Pain Type: Acute pain Pain Location: Shoulder Pain Orientation: Left Pain Descriptors / Indicators: Aching Pain Frequency: Intermittent Vision/Perception  Perception Perception: Within Functional Limits Praxis Praxis: Intact  Cognition Orientation Level:  Oriented X4 Sensation Sensation Light Touch: Impaired by gross assessment(Pt reports absent sensation in R anterior/lateral thigh, pt states it has been like this since onset of sickness) Proprioception: Appears Intact Coordination Gross Motor Movements are Fluid and Coordinated: Yes Motor  Motor Motor: Within Functional Limits Motor - Discharge Observations: generalized weakness  Mobility Bed Mobility Bed Mobility: Rolling Right;Supine to Sit;Rolling Left;Sit to Supine Rolling Right: Supervision/verbal cueing Rolling Left: Supervision/Verbal cueing Supine to Sit: Supervision/Verbal cueing Sit to Supine: Supervision/Verbal cueing Transfers Transfers: Stand to Sit;Sit to Stand;Stand Pivot Transfers Sit to Stand: Supervision/Verbal cueing Stand to Sit: Supervision/Verbal cueing Stand Pivot Transfers: Supervision/Verbal cueing Transfer (Assistive device): Rolling walker Locomotion  Gait Ambulation: Yes Gait Assistance: Supervision/Verbal cueing Gait Distance (Feet): 150 Feet Assistive device: Rolling walker Gait Assistance Details: Verbal cues for precautions/safety;Verbal cues for gait pattern Gait Gait: Yes Gait Pattern: Impaired Gait Pattern: Shuffle;Poor foot clearance - right;Poor foot clearance - left;Wide base of support Gait velocity: decreased Stairs / Additional Locomotion Stairs: Yes Stairs Assistance: Supervision/Verbal cueing Stair Management Technique: Two rails Number of Stairs: 8 Height of Stairs: 3 Curb: Contact Guard/Touching assist Wheelchair Mobility Wheelchair Mobility: No  Trunk/Postural Assessment  Cervical Assessment Cervical Assessment: Exceptions to WFL(forward head, rounded shoulder posture) Thoracic Assessment Thoracic Assessment: Within Functional Limits Lumbar Assessment Lumbar Assessment: Exceptions to WFL(posterior pelvic tilt, chronic low back pain at baseline) Postural Control Postural Control: Within Functional Limits   Balance Static Sitting Balance Static Sitting - Level of Assistance: 7: Independent Dynamic Sitting Balance Dynamic Sitting - Level of Assistance: 7: Independent Static Standing Balance Static Standing - Level of Assistance: 5: Stand by assistance Dynamic Standing Balance Dynamic Standing - Level of Assistance: 5: Stand by assistance Extremity Assessment  RLE Assessment Passive Range of Motion (PROM) Comments: WFL General Strength Comments: Globally 3+ to 4-/5 LLE Assessment LLE Assessment: Exceptions to [facility] Passive Range of Motion (PROM) Comments: [facility] General Strength Comments: Globally 3+ to 4-/5    Tylar Amborn 08/14/2019, 1:44 PM

## 2019-08-13 NOTE — Progress Notes (Signed)
Physical Therapy Session Note  Patient Details  Name: Vanessa Mack MRN: [id number] Date of Birth: 11/23/1950  Today's Date: 08/13/2019 PT Individual Time: 1100-1153 PT Individual Time Calculation (min): 53 min   Short Term Goals: Week 4:  PT Short Term Goal 1 (Week 4): STG = LTG due to estimated d/c date.  Skilled Therapeutic Interventions/Progress Updates:   Pt in w/c and agreeable to therapy, 6/10 pain R knee. Pt ambulated to/from therapy gym w/ RW and supervision. 1 seated rest break each way, 100-150' at a time. Practiced stair negotiation at 3" steps x8 w/ B rails, CGA. Educated pt on RW management over curbs for community mobility and/or threshold negotiation at [employer]. Pt performed w/ CGA. NuStep 5 min x2 @ level 3 w/ all extremities to work on functional endurance and strengthening in decreased WB position 2/2 R knee pain. Ambulated back to room and ended session in w/c, all needs in reach. Ice applied to R knee for pain relief, RN made aware of pt's request for pain meds.   Therapy Documentation Precautions:  Precautions Precautions: Fall, Other (comment) Precaution Comments: monitor HR, SpO2 Restrictions Weight Bearing Restrictions: No  Therapy/Group: Individual Therapy  Vanessa Mack 08/13/2019, 11:57 AM

## 2019-08-13 NOTE — Progress Notes (Signed)
Occupational Therapy Session Note  Patient Details  Name: Vanessa Mack MRN: [id number] Date of Birth: 1950/10/11  Today's Date: 08/13/2019 OT Individual Time: 0900-1000 OT Individual Time Calculation (min): 60 min    Short Term Goals: Week 1:  OT Short Term Goal 1 (Week 1): Pt will perform toilet transfer with mod A OT Short Term Goal 1 - Progress (Week 1): Met OT Short Term Goal 2 (Week 1): Pt will thread LB clothing with mod A sit to stand OT Short Term Goal 2 - Progress (Week 1): Met OT Short Term Goal 3 (Week 1): Pt will perform sit to stand with mod A consistently OT Short Term Goal 3 - Progress (Week 1): Met  Skilled Therapeutic Interventions/Progress Updates:    1:1. Pt received in bed agreeable to bathing and dressing. Pt completes BADL with S overall for ambulation with RW and VC for RW management during transfer to toilet and TTB. Pt completes toileting and bathing at seated level with S. Pt requires VC for locking w/c brakes from seated level in w/c for brake management when reaching forward. Pt completes ambulatory transfer from w/c to couch as this is preferred seat at home. Pt requires min boost to stand from low couch. Exited session with pt seated in w/c, call light in reach and all need smet  Therapy Documentation Precautions:  Precautions Precautions: Fall, Other (comment) Precaution Comments: monitor HR, SpO2 Restrictions Weight Bearing Restrictions: No General:   Vital Signs: Therapy Vitals Temp: 98.5 F (36.9 C) Temp Source: Oral Pulse Rate: (!) 127 Resp: 18 BP: 120/67 Patient Position (if appropriate): Standing Oxygen Therapy SpO2: 95 % O2 Device: Room Air Pain:   ADL: ADL Equipment Provided: Leg straps Grooming: Minimal assistance Upper Body Bathing: Minimal assistance Lower Body Bathing: Maximal assistance Where Assessed-Lower Body Bathing: Edge of bed Upper Body Dressing: Minimal assistance Where Assessed-Upper Body Dressing: Edge of  bed Lower Body Dressing: Dependent Where Assessed-Lower Body Dressing: Edge of bed Toileting: Dependent Where Assessed-Toileting: Glass blower/designer: Maximal Print production planner Method: Nutritional therapist: Not assessed Vision   Perception    Praxis   Exercises:   Other Treatments:     Therapy/Group: Individual Therapy  Tonny Branch 08/13/2019, 6:59 AM

## 2019-08-13 NOTE — Progress Notes (Signed)
Occupational Therapy Session Note  Patient Details  Name: Vanessa Mack MRN: [id number] Date of Birth: [birth year]  Today's Date: 08/13/2019 OT Individual Time: 1400-1511 OT Individual Time Calculation (min): 71 min    Short Term Goals: Week 1:  OT Short Term Goal 1 (Week 1): Pt will perform toilet transfer with mod A OT Short Term Goal 1 - Progress (Week 1): Met OT Short Term Goal 2 (Week 1): Pt will thread LB clothing with mod A sit to stand OT Short Term Goal 2 - Progress (Week 1): Met OT Short Term Goal 3 (Week 1): Pt will perform sit to stand with mod A consistently OT Short Term Goal 3 - Progress (Week 1): Met  Skilled Therapeutic Interventions/Progress Updates:    1:1. Pt received in w/c ready to go. Pt ambulates to/from all tx destinations with RW and VC for relaxing shoulders away from ears. Pt completes kitchen mobility at ambulatory level with RW and use of reacher to retrieve bean bags from floor to top shelf surfaces with S and good management of RW. Edu re use of walker tray for transportation of items/meals in home and given handout of info on purchasing per pt request. Pt completes standing balance wii fit game with improved weight shifting with hands on walker. With no UE support pt requires mod tactile cues for weight shifts at hips not head. Exited session with pt seated in bed, exit alarm on and call light in reach  Therapy Documentation Precautions:  Precautions Precautions: Fall, Other (comment) Precaution Comments: monitor HR, SpO2 Restrictions Weight Bearing Restrictions: No General:   Vital Signs:  Pain: Pain Assessment Pain Scale: 0-10 Pain Score: 6  ADL: ADL Equipment Provided: Leg straps Grooming: Minimal assistance Upper Body Bathing: Minimal assistance Lower Body Bathing: Maximal assistance Where Assessed-Lower Body Bathing: Edge of bed Upper Body Dressing: Minimal assistance Where Assessed-Upper Body Dressing: Edge of bed Lower Body  Dressing: Dependent Where Assessed-Lower Body Dressing: Edge of bed Toileting: Dependent Where Assessed-Toileting: Glass blower/designer: Maximal Print production planner Method: Nutritional therapist: Not assessed Vision   Perception    Praxis Praxis: Intact Exercises:   Other Treatments:     Therapy/Group: Individual Therapy  Tonny Branch 08/13/2019, 3:12 PM

## 2019-08-13 NOTE — Progress Notes (Signed)
Pt stated she prefer to told the metprolol. Pt states I do not want my BP to drop to low.

## 2019-08-13 NOTE — Progress Notes (Signed)
Vanessa Mack, NT collected CBG- 106

## 2019-08-14 ENCOUNTER — Inpatient Hospital Stay (HOSPITAL_COMMUNITY): Payer: Medicare Other

## 2019-08-14 ENCOUNTER — Inpatient Hospital Stay (HOSPITAL_COMMUNITY): Payer: Medicare Other | Admitting: Occupational Therapy

## 2019-08-14 DIAGNOSIS — G479 Sleep disorder, unspecified: Secondary | ICD-10-CM | POA: Diagnosis not present

## 2019-08-14 DIAGNOSIS — E119 Type 2 diabetes mellitus without complications: Secondary | ICD-10-CM | POA: Diagnosis not present

## 2019-08-14 DIAGNOSIS — E876 Hypokalemia: Secondary | ICD-10-CM | POA: Diagnosis not present

## 2019-08-14 DIAGNOSIS — R5381 Other malaise: Secondary | ICD-10-CM | POA: Diagnosis not present

## 2019-08-14 LAB — GLUCOSE, CAPILLARY
Glucose-Capillary: 109 mg/dL — ABNORMAL HIGH (ref 70–99)
Glucose-Capillary: 89 mg/dL (ref 70–99)
Glucose-Capillary: 125 mg/dL — ABNORMAL HIGH (ref 70–99)
Glucose-Capillary: 84 mg/dL (ref 70–99)

## 2019-08-14 NOTE — Progress Notes (Signed)
Occupational Therapy Discharge Summary  Patient Details  Name: Vanessa Mack MRN: [id number] Date of Birth: 05-01-1951  Today's Date: 08/14/2019 OT Individual Time: 1423-9532 OT Individual Time Calculation (min): 75 min    OT treatment session focused on increased independence with BADL tasks. Pt able to access dresser drawers, collect clothing, ambulate to bathroom with RW all without assist from OT. Bathing/dressing completed supervision with increased time. Pt sat to dry hair 2/2 fatigue, but able to complete with set-up A. Pt given squeeze ball for continued work on grip strength. OT discussed home modifications for safe BADL participation and answered any questions regarding dc tomorrow. Pt left seated in wc at end of session with chair alarm on, call bell in reach, and needs met.   Patient has met 9 of 9 long term goals due to improved activity tolerance, improved balance, postural control, ability to compensate for deficits, functional use of  RIGHT upper, RIGHT lower, LEFT upper and LEFT lower extremity and improved coordination.  Patient to discharge at overall Supervision level.  Patient's care partner is independent to provide the necessary physical assistance at discharge.    Reasons goals not met: n/a  Recommendation:  Patient will benefit from ongoing skilled OT services in home health setting to continue to advance functional skills in the area of BADL.  Equipment: tub transfer bench  Reasons for discharge: treatment goals met and discharge from hospital  Patient/family agrees with progress made and goals achieved: Yes  OT Discharge Precautions/Restrictions  Precautions Precautions: Fall Restrictions Weight Bearing Restrictions: No Pain Pain Assessment Pain Scale: 0-10 Pain Score: 4  Pain Type: Acute pain Pain Location: Shoulder Pain Orientation: Left Pain Descriptors / Indicators: Aching Pain Frequency: Intermittent Repositioned for  comfort ADL ADL Equipment Provided: Reacher, Long-handled sponge Eating: Independent Grooming: Independent Upper Body Bathing: Setup Lower Body Bathing: Supervision/safety Where Assessed-Lower Body Bathing: Edge of bed Upper Body Dressing: Setup Where Assessed-Upper Body Dressing: Edge of bed Lower Body Dressing: Supervision/safety Where Assessed-Lower Body Dressing: Edge of bed Toileting: Supervision/safety Where Assessed-Toileting: Glass blower/designer: Distant supervision Armed forces technical officer Method: Photographer Transfer: Distant supervision Social research officer, government: Not assessed Perception  Perception: Within Functional Limits Praxis Praxis: Intact Cognition Overall Cognitive Status: Within Functional Limits for tasks assessed Arousal/Alertness: Awake/alert Orientation Level: Oriented X4 Awareness: Appears intact Problem Solving: Appears intact Safety/Judgment: Appears intact Sensation Sensation Additional Comments: Pt reports still having some gross numbness in hands, but much improved since eval Coordination Gross Motor Movements are Fluid and Coordinated: Yes Fine Motor Movements are Fluid and Coordinated: Yes Coordination and Movement Description: increased smoothness and accuracy  Motor  Motor Motor: Within Functional Limits Mobility  Transfers Sit to Stand: Supervision/Verbal cueing Stand to Sit: Supervision/Verbal cueing  Balance Static Sitting Balance Static Sitting - Level of Assistance: 7: Independent Dynamic Sitting Balance Dynamic Sitting - Level of Assistance: 7: Independent Static Standing Balance Static Standing - Level of Assistance: 5: Stand by assistance Dynamic Standing Balance Dynamic Standing - Level of Assistance: 5: Stand by assistance Extremity/Trunk Assessment RUE Assessment General Strength Comments: 4/5 overall LUE Assessment Active Range of Motion (AROM) Comments: Slight ROM deficist with L shoulder 2/2 baseline arthritis   General Strength Comments: 4/5   Daneen Schick Eliberto Sole 08/14/2019, 12:48 PM

## 2019-08-14 NOTE — Progress Notes (Signed)
Social Work Patient ID: Vanessa Mack, female   DOB: 22-Apr-1951, 68 y.o.   MRN: [id number]  Pt very pleased with progress and feeling ready for d/c tomorrow.  Have reviewed [name] and DME orders.  Have submitted request for restart of PCS aide services at home as well.    Kelvyn Schunk, LCSW

## 2019-08-14 NOTE — Plan of Care (Signed)
  Problem: RH BOWEL ELIMINATION Goal: RH STG MANAGE BOWEL WITH ASSISTANCE Description: STG Manage Bowel with Min Assistance. 08/14/2019 0836 by Amanda Cockayne, LPN Outcome: Progressing    Problem: RH BLADDER ELIMINATION Goal: RH STG MANAGE BLADDER WITH ASSISTANCE Description: STG Manage Bladder With Min Assistance 08/14/2019 0836 by Amanda Cockayne, LPN Outcome: Progressing    Problem: RH SKIN INTEGRITY Goal: RH STG SKIN FREE OF INFECTION/BREAKDOWN Description: No new breakdown with min assist  08/14/2019 0836 by Amanda Cockayne, LPN Outcome: Progressing    Problem: RH SAFETY Goal: RH STG ADHERE TO SAFETY PRECAUTIONS W/ASSISTANCE/DEVICE Description: STG Adhere to Safety Precautions With min Assistance/Device. 08/14/2019 0836 by Amanda Cockayne, LPN Outcome: Progressing  Problem: RH SAFETY Goal: RH STG ADHERE TO SAFETY PRECAUTIONS W/ASSISTANCE/DEVICE Description: STG Adhere to Safety Precautions With min Assistance/Device. 08/14/2019 0836 by Amanda Cockayne, LPN Outcome: Progressing

## 2019-08-14 NOTE — Progress Notes (Addendum)
Physical Therapy Session Note  Patient Details  Name: Vanessa Mack MRN: [id number] Date of Birth: 06-20-1951  Today's Date: 08/14/2019 PT Individual Time: 6967-8938 PT Individual Time Calculation (min): 30 min   Short Term Goals: Week 1:  PT Short Term Goal 1 (Week 1): Pt will perform bed mobility with min assist PT Short Term Goal 1 - Progress (Week 1): Met PT Short Term Goal 2 (Week 1): Pt will perform bed<>chair transfer with mod assist PT Short Term Goal 2 - Progress (Week 1): Met PT Short Term Goal 3 (Week 1): Pt will initiate gait training PT Short Term Goal 3 - Progress (Week 1): Met Week 2:  PT Short Term Goal 1 (Week 2): Pt will ambulate 25 ft with LRAD & min assist +1. PT Short Term Goal 1 - Progress (Week 2): Met PT Short Term Goal 2 (Week 2): Pt will complete bed<>w/c via stand pivot with min assist +1. PT Short Term Goal 2 - Progress (Week 2): Met PT Short Term Goal 3 (Week 2): Pt will complete bed mobility with bed flat, without rails with min assist. PT Short Term Goal 3 - Progress (Week 2): Progressing toward goal Week 3:  PT Short Term Goal 1 (Week 3): Pt will ambulate 50 ft with LRAD & min assist +1. PT Short Term Goal 1 - Progress (Week 3): Met PT Short Term Goal 2 (Week 3): Pt will negotiate 4 steps with B rails & min assist. PT Short Term Goal 2 - Progress (Week 3): Met PT Short Term Goal 3 (Week 3): Pt will complete car transfer with min assist +1. PT Short Term Goal 3 - Progress (Week 3): Met PT Short Term Goal 4 (Week 3): Pt will complete stand pivot transfers with LRAD & CGA. PT Short Term Goal 4 - Progress (Week 3): Met  Skilled Therapeutic Interventions/Progress Updates:    Pt initially OOB in wc.  STS from wc w/reminder to lock brakes, distant supervision.  Gait 13f w/supervision, pt stopped and stood w/walker and supervision while ordering lunch (approx 3 min) during which she pivoted to R  And sidestepped w/supervision. Continued gait x 536fw/mild  loss of balance w/independent self recovery when attemting to turn head to look back at therapist.  Pt w/marked bilat trendelenberg.  Cues for pace/educated re: progressing gait speed w/caution due to decreased stability w/increased cadence.  Pt transported to gym via wc. Step ups on 5inch step using bilat handrails repeated x 4 each side for LE strengthening. Standing in parallel bars performed alternating hip abd w/isometric hold at end range x  10 each side for hip strengthening/improved stability w/gait.  Gait 12575fall to bed w/RW and distant supervision as above.  Turn/sit transfer to bed w/distant supervision.  Sit to supine independently. Pt left supine w/rails up x 3, alarm set, bed in lowest position, and needs in reach.  Pt has made excellent progress, discussed stability/balance deficits and falls risk imlications.      Therapy Documentation Precautions:  Precautions Precautions: Fall Precaution Comments: monitor HR, SpO2 Restrictions Weight Bearing Restrictions: No  PAIN:  Denies pain this pm.   Therapy/Group: Individual Therapy  BarCallie FieldingT Victor/06/2019, 1:47 PM

## 2019-08-14 NOTE — Progress Notes (Signed)
[facility] PHYSICAL MEDICINE & REHABILITATION PROGRESS NOTE   Subjective/Complaints: No complaints other than her left shoulder. Very pleased to be going home tomorrow!  ROS: Patient denies fever, rash, sore throat, blurred vision, nausea, vomiting, diarrhea, cough, shortness of breath or chest pain,   headache, or mood change.    Objective:   No results found. No results for input(s): WBC, HGB, HCT, PLT in the last 72 hours. No results for input(s): NA, K, CL, CO2, GLUCOSE, BUN, CREATININE, CALCIUM in the last 72 hours.  Intake/Output Summary (Last 24 hours) at 08/14/2019 0927 Last data filed at 08/14/2019 0758 Gross per 24 hour  Intake 560 ml  Output -  Net 560 ml     Physical Exam: Vital Signs Blood pressure 120/79, pulse (!) 106, temperature 98.5 F (36.9 C), temperature source Oral, resp. rate 20, height 5\' 7"  (1.702 m), weight 99.9 kg, SpO2 92 %.  Constitutional: No distress . Vital signs reviewed. HEENT: EOMI, oral membranes moist Neck: supple Cardiovascular: RRR without murmur. No JVD    Respiratory: CTA Bilaterally without wheezes or rales. Normal effort    GI: BS +, non-tender, non-distended   Extremities: No clubbing, cyanosis. Tr LE edema ongoing. Pulses are 2+ Skin: Clean and intact without signs of breakdown . Neuro: Pt is cognitively appropriate with normal insight, memory, and awareness. Cranial nerves 2-12 are intact. Sensory exam is normal. Reflexes are 1+ in all 4's. Fine motor coordination is intact. No tremors. Motor function is grossly 4/5 UE, 3+/5 HF, 3+/5 KE and 4+/5 ADF/PF.  Musculoskeletal: LB,LE pain with movement Left shoulder tender with rotation and at [facility] jt with palp Psych: pleasant as always   Assessment/Plan: 1. Functional deficits secondary to debility after COVID which require 3+ hours per day of interdisciplinary therapy in a comprehensive inpatient rehab setting.  Physiatrist is providing close team supervision and 24 hour management of  active medical problems listed below.  Physiatrist and rehab team continue to assess barriers to discharge/monitor patient progress toward functional and medical goals  Care Tool:  Bathing    Body parts bathed by patient: Right arm, Left arm, Chest, Abdomen, Face, Right upper leg, Left upper leg, Front perineal area, Buttocks, Right lower leg, Left lower leg   Body parts bathed by helper: Front perineal area, Buttocks, Right lower leg, Left lower leg     Bathing assist Assist Level: Set up assist     Upper Body Dressing/Undressing Upper body dressing   What is the patient wearing?: Pull over shirt    Upper body assist Assist Level: Independent    Lower Body Dressing/Undressing Lower body dressing      What is the patient wearing?: Pants, Incontinence brief     Lower body assist Assist for lower body dressing: Set up assist     Toileting Toileting    Toileting assist Assist for toileting: Set up assist     Transfers Chair/bed transfer  Transfers assist  Chair/bed transfer activity did not occur: Safety/medical concerns  Chair/bed transfer assist level: Supervision/Verbal cueing     Locomotion Ambulation   Ambulation assist   Ambulation activity did not occur: Safety/medical concerns  Assist level: Supervision/Verbal cueing Assistive device: Walker-rolling Max distance: 150'   Walk 10 feet activity   Assist  Walk 10 feet activity did not occur: Safety/medical concerns  Assist level: Supervision/Verbal cueing Assistive device: Walker-rolling   Walk 50 feet activity   Assist Walk 50 feet with 2 turns activity did not occur: Safety/medical concerns  Assist level: Supervision/Verbal cueing Assistive device: Walker-rolling    Walk 150 feet activity   Assist Walk 150 feet activity did not occur: Safety/medical concerns  Assist level: Supervision/Verbal cueing Assistive device: Walker-rolling    Walk 10 feet on uneven surface   activity   Assist Walk 10 feet on uneven surfaces activity did not occur: Safety/medical concerns         Wheelchair     Assist Will patient use wheelchair at discharge?: No Type of Wheelchair: Manual Wheelchair activity did not occur: N/A  Wheelchair assist level: Supervision/Verbal cueing Max wheelchair distance: 100 ft    Wheelchair 50 feet with 2 turns activity    Assist    Wheelchair 50 feet with 2 turns activity did not occur: N/A   Assist Level: Supervision/Verbal cueing   Wheelchair 150 feet activity     Assist  Wheelchair 150 feet activity did not occur: N/A   Assist Level: Supervision/Verbal cueing   Blood pressure 120/79, pulse (!) 106, temperature 98.5 F (36.9 C), temperature source Oral, resp. rate 20, height 5\' 7"  (1.702 m), weight 99.9 kg, SpO2 92 %.  Medical Problem List and Plan: 1. Impaired mobility and ADLs secondary to COVID-19  --Continue CIR therapies including PT, OT--progressing towards goals    -ELOS  Moved up to 08/15/2019 2. Antithrombotics: -DVT/anticoagulation:Pharmaceutical:Lovenox -antiplatelet therapy: N/A 3.Chronic LBP/Pain Management:    heating pads 3 times per day PRN for low back pain. Continue lidocaine patch. Was on  oxycontin 15mg  qid pta  11/27-for lbp/leg pain increased oxycodone to 5-10mg  q4 prn with good results   -tylenol prn  12/7: left shoulder pain-- mild adh cap, RTC, AC jt arthritis also   -modest relief with injection 12/2    -continue rom with therapies,K-tape   -consider AC jt injection   -added sports cream to shoulder 4. Mood:LCSW to follow for evaluation and support. Team to provide ego support. -antipsychotic agents: N/A 5. Neuropsych: This patientiscapable of making decisions on herown behalf. 6. Skin/Wound Care:Routine pressure relief measures.  -pinpoint anal lesion improving 7. Fluids/Electrolytes/Nutrition:encourage PO  -mild hypokalemia-continue  supplement.  -persistent hypokalemia--resumed supplementation-     -potassium 3.9 12/4--continue supp 8. COPD: Has been on IV solumedrol --->prednisone tapered to off 9. T2DM with neuropathy: Continue Lantus 30 units daily.   -was on metformin xr at home pta (will not resume-discussed with her)  -began amaryl 1mg  daily on 11/20   -off steroids  -continue SSI for coverage CBG (last 3)  Recent Labs    08/13/19 1703 08/13/19 2100 08/14/19 0629  GLUCAP 127* 94 109*  controlled 12/10 10. H/o depression: Monitor for now--not on any meds at this time. Seems very motivated 11. HTN: Monitor BP tid--currently on metoprolol 25mg  BID    Had one episode where her systolic was 87.  Will check orthostatic BPs, given that heart rate has been running around 100 on beta-blocker would like to continue this Vitals:   08/13/19 2005 08/14/19 0531  BP: (!) 96/59 120/79  Pulse: 86 (!) 106  Resp: 18 20  Temp: 98 F (36.7 C) 98.5 F (36.9 C)  SpO2: 93% 92%  Blood pressure in normal range today 12/10 continue current lopressor dose 25mg  BID to control HR -could HR increase be d/t provigil?---will dc today 12. OSA: CPAP at nights with 2L oxygen bled in.  13. Insomnia: improved   -trazodone increased to home dose with improvement, 300mg  qhs 14. Anxiety: Xanax 0.125mg  BID PRN. Was on 1mg  BID PTA. --won't resume right  now 15. HLD: Continue atorvastatin 10mg  HS. 16. Leukocytosis d/t steroids  -steroids tapered off  - wbc's 7.6 12/7   LOS: 23 days A FACE TO FACE EVALUATION WAS PERFORMED  Meredith Staggers 08/14/2019, 9:27 AM

## 2019-08-15 ENCOUNTER — Telehealth: Payer: Self-pay | Admitting: *Deleted

## 2019-08-15 DIAGNOSIS — I10 Essential (primary) hypertension: Secondary | ICD-10-CM

## 2019-08-15 DIAGNOSIS — E669 Obesity, unspecified: Secondary | ICD-10-CM

## 2019-08-15 DIAGNOSIS — E1169 Type 2 diabetes mellitus with other specified complication: Secondary | ICD-10-CM | POA: Diagnosis not present

## 2019-08-15 DIAGNOSIS — R5381 Other malaise: Secondary | ICD-10-CM | POA: Diagnosis not present

## 2019-08-15 LAB — GLUCOSE, CAPILLARY: Glucose-Capillary: 107 mg/dL — ABNORMAL HIGH (ref 70–99)

## 2019-08-15 MED ORDER — FAMOTIDINE 20 MG PO TABS: 20.0000 mg | ORAL_TABLET | Freq: Two times a day (BID) | ORAL | 0 refills | Status: DC

## 2019-08-15 MED ORDER — TRAZODONE HCL 150 MG PO TABS: 300.0000 mg | ORAL_TABLET | Freq: Every day | ORAL | 0 refills | Status: AC

## 2019-08-15 MED ORDER — OXYCODONE HCL 10 MG PO TABS: 10.0000 mg | ORAL_TABLET | Freq: Four times a day (QID) | ORAL | 0 refills | Status: AC | PRN

## 2019-08-15 MED ORDER — SENNOSIDES-DOCUSATE SODIUM 8.6-50 MG PO TABS: 2.0000 | ORAL_TABLET | Freq: Every day | ORAL | 0 refills | Status: DC

## 2019-08-15 MED ORDER — TRESIBA 100 UNIT/ML ~~LOC~~ SOLN: 30.0000 [IU] | Freq: Every day | SUBCUTANEOUS | 0 refills | Status: DC

## 2019-08-15 MED ORDER — "INSULIN SYRINGE-NEEDLE U-100 30G X 1/2"" 0.5 ML MISC": 1.0000 "application " | Freq: Every day | 0 refills | Status: AC

## 2019-08-15 MED ORDER — BENZONATATE 100 MG PO CAPS: 100.0000 mg | ORAL_CAPSULE | Freq: Three times a day (TID) | ORAL | 0 refills | Status: AC | PRN

## 2019-08-15 MED ORDER — INSULIN STARTER KIT- SYRINGES (ENGLISH)
1.0000 | Freq: Once | Status: AC
  Administered 2019-08-15: 1
  Filled 2019-08-15 (×2): qty 1

## 2019-08-15 MED ORDER — POTASSIUM CHLORIDE CRYS ER 20 MEQ PO TBCR: 20.0000 meq | EXTENDED_RELEASE_TABLET | Freq: Two times a day (BID) | ORAL | 0 refills | Status: AC

## 2019-08-15 MED ORDER — LIDOCAINE 5 % EX PTCH: 2.0000 | MEDICATED_PATCH | CUTANEOUS | 0 refills | Status: AC

## 2019-08-15 MED ORDER — TRESIBA 100 UNIT/ML ~~LOC~~ SOLN: 20.0000 [IU] | Freq: Every day | SUBCUTANEOUS | 0 refills | Status: DC

## 2019-08-15 MED ORDER — PRO-STAT SUGAR FREE PO LIQD: 30.0000 mL | Freq: Two times a day (BID) | ORAL | 0 refills | Status: AC

## 2019-08-15 MED ORDER — ALPRAZOLAM 0.25 MG PO TABS: 0.1250 mg | ORAL_TABLET | Freq: Every day | ORAL | 0 refills | Status: AC | PRN

## 2019-08-15 MED ORDER — ADULT MULTIVITAMIN W/MINERALS CH: 1.0000 | ORAL_TABLET | Freq: Every day | ORAL | 0 refills | Status: AC

## 2019-08-15 MED ORDER — MUSCLE RUB 10-15 % EX CREA: 1.0000 "application " | TOPICAL_CREAM | Freq: Three times a day (TID) | CUTANEOUS | 0 refills | Status: AC

## 2019-08-15 MED ORDER — ACETAMINOPHEN 325 MG PO TABS: 325.0000 mg | ORAL_TABLET | ORAL | Status: AC | PRN

## 2019-08-15 MED ORDER — GLIMEPIRIDE 1 MG PO TABS: 1.0000 mg | ORAL_TABLET | Freq: Every day | ORAL | 0 refills | Status: AC

## 2019-08-15 MED ORDER — ATORVASTATIN CALCIUM 10 MG PO TABS: 10.0000 mg | ORAL_TABLET | Freq: Every day | ORAL | 0 refills | Status: DC

## 2019-08-15 MED ORDER — METOPROLOL TARTRATE 25 MG PO TABS: 25.0000 mg | ORAL_TABLET | Freq: Two times a day (BID) | ORAL | 0 refills | Status: DC

## 2019-08-15 MED ORDER — TRESIBA FLEXTOUCH 100 UNIT/ML ~~LOC~~ SOPN: 20.0000 [IU] | PEN_INJECTOR | Freq: Every day | SUBCUTANEOUS | Status: DC

## 2019-08-15 NOTE — Plan of Care (Signed)
  Problem: RH BOWEL ELIMINATION Goal: RH STG MANAGE BOWEL WITH ASSISTANCE Description: STG Manage Bowel with Min Assistance. Outcome: Completed/Met   Problem: RH BLADDER ELIMINATION Goal: RH STG MANAGE BLADDER WITH ASSISTANCE Description: STG Manage Bladder With Min Assistance Outcome: Completed/Met   Problem: RH SKIN INTEGRITY Goal: RH STG SKIN FREE OF INFECTION/BREAKDOWN Description: No new breakdown with min assist  Outcome: Completed/Met   Problem: RH SAFETY Goal: RH STG ADHERE TO SAFETY PRECAUTIONS W/ASSISTANCE/DEVICE Description: STG Adhere to Safety Precautions With min Assistance/Device. Outcome: Completed/Met   Problem: RH PAIN MANAGEMENT Goal: RH STG PAIN MANAGED AT OR BELOW PT'S PAIN GOAL Description: <3 out of 10.  Outcome: Completed/Met

## 2019-08-15 NOTE — Progress Notes (Signed)
[facility] PHYSICAL MEDICINE & REHABILITATION PROGRESS NOTE   Subjective/Complaints: No new problems. Thankful to be going home today!  ROS: Patient denies fever, rash, sore throat, blurred vision, nausea, vomiting, diarrhea, cough, shortness of breath or chest pain, joint or back pain, headache, or mood change.   Objective:   No results found. No results for input(s): WBC, HGB, HCT, PLT in the last 72 hours. No results for input(s): NA, K, CL, CO2, GLUCOSE, BUN, CREATININE, CALCIUM in the last 72 hours.  Intake/Output Summary (Last 24 hours) at 08/15/2019 0947 Last data filed at 08/15/2019 0700 Gross per 24 hour  Intake 1530 ml  Output --  Net 1530 ml     Physical Exam: Vital Signs Blood pressure 114/84, pulse 98, temperature 98.3 F (36.8 C), temperature source Oral, resp. rate 18, height 5\' 7"  (1.702 m), weight 99.9 kg, SpO2 93 %.  Constitutional: No distress . Vital signs reviewed. HEENT: EOMI, oral membranes moist Neck: supple Cardiovascular: RRR without murmur. No JVD    Respiratory: CTA Bilaterally without wheezes or rales. Normal effort    GI: BS +, non-tender, non-distended   Extremities: No clubbing, cyanosis. Tr LE edema ongoing. Pulses are 2+ Skin: Clean and intact without signs of breakdown . Neuro: Pt is cognitively appropriate with normal insight, memory, and awareness. Cranial nerves 2-12 are intact. Sensory exam is normal. Reflexes are 1+ in all 4's. Fine motor coordination is intact. No tremors. Motor function is grossly 4/5 UE, 3+/5 HF, 3+/5 KE and 4+/5 ADF/PF.  Musculoskeletal: left shoulder tender Psych: pleasant   Assessment/Plan: 1. Functional deficits secondary to debility after COVID which require 3+ hours per day of interdisciplinary therapy in a comprehensive inpatient rehab setting.  Physiatrist is providing close team supervision and 24 hour management of active medical problems listed below.  Physiatrist and rehab team continue to assess  barriers to discharge/monitor patient progress toward functional and medical goals  Care Tool:  Bathing    Body parts bathed by patient: Right arm, Left arm, Chest, Abdomen, Face, Right upper leg, Left upper leg, Front perineal area, Buttocks, Right lower leg, Left lower leg   Body parts bathed by helper: Front perineal area, Buttocks, Right lower leg, Left lower leg     Bathing assist Assist Level: Independent with assistive device Assistive Device Comment: LH sponge   Upper Body Dressing/Undressing Upper body dressing   What is the patient wearing?: Pull over shirt    Upper body assist Assist Level: Independent    Lower Body Dressing/Undressing Lower body dressing      What is the patient wearing?: Pants, Incontinence brief     Lower body assist Assist for lower body dressing: Set up assist     Toileting Toileting    Toileting assist Assist for toileting: Supervision/Verbal cueing     Transfers Chair/bed transfer  Transfers assist  Chair/bed transfer activity did not occur: Safety/medical concerns  Chair/bed transfer assist level: Supervision/Verbal cueing     Locomotion Ambulation   Ambulation assist   Ambulation activity did not occur: Safety/medical concerns  Assist level: Supervision/Verbal cueing Assistive device: Walker-rolling Max distance: 200   Walk 10 feet activity   Assist  Walk 10 feet activity did not occur: Safety/medical concerns  Assist level: Supervision/Verbal cueing Assistive device: Walker-rolling   Walk 50 feet activity   Assist Walk 50 feet with 2 turns activity did not occur: Safety/medical concerns  Assist level: Supervision/Verbal cueing Assistive device: Walker-rolling    Walk 150 feet activity  Assist Walk 150 feet activity did not occur: Safety/medical concerns  Assist level: Supervision/Verbal cueing Assistive device: Walker-rolling    Walk 10 feet on uneven surface  activity   Assist Walk 10 feet  on uneven surfaces activity did not occur: Safety/medical concerns   Assist level: Contact Guard/Touching assist Assistive device: Aeronautical engineer Will patient use wheelchair at discharge?: No Type of Wheelchair: Manual Wheelchair activity did not occur: N/A  Wheelchair assist level: Supervision/Verbal cueing Max wheelchair distance: 100 ft    Wheelchair 50 feet with 2 turns activity    Assist    Wheelchair 50 feet with 2 turns activity did not occur: N/A   Assist Level: Supervision/Verbal cueing   Wheelchair 150 feet activity     Assist  Wheelchair 150 feet activity did not occur: N/A   Assist Level: Supervision/Verbal cueing   Blood pressure 114/84, pulse 98, temperature 98.3 F (36.8 C), temperature source Oral, resp. rate 18, height 5\' 7"  (1.702 m), weight 99.9 kg, SpO2 93 %.  Medical Problem List and Plan: 1. Impaired mobility and ADLs secondary to COVID-19  Dc home today  -Patient to see Dr Ranell Patrick in the office for transitional care encounter in 1-2 weeks. 2. Antithrombotics: -DVT/anticoagulation:Pharmaceutical:Lovenox -antiplatelet therapy: N/A 3.Chronic LBP/Pain Management:    heating pads 3 times per day PRN for low back pain. Continue lidocaine patch. Was on  oxycontin 15mg  qid pta  11/27-for lbp/leg pain increased oxycodone to 5-10mg  q4 prn with good results   -tylenol prn  12/7: left shoulder pain-- mild adh cap, RTC, AC jt arthritis also   -modest relief with injection 12/2    -continue rom with therapies,K-tape   -consider AC jt injection as outpt   -consider MRI as outpt to further define injury 4. Mood:LCSW to follow for evaluation and support. Team to provide ego support. -antipsychotic agents: N/A 5. Neuropsych: This patientiscapable of making decisions on herown behalf. 6. Skin/Wound Care:Routine pressure relief measures.  -pinpoint anal lesion improving 7.  Fluids/Electrolytes/Nutrition:encourage PO  -mild hypokalemia-continue supplement.  -persistent hypokalemia--resumed supplementation-     -potassium 3.9 12/4--continue supp 8. COPD: Has been on IV solumedrol --->prednisone tapered to off 9. T2DM with neuropathy: Continue Lantus 30 units daily.   -was on metformin xr at home pta (will not resume-discussed with her)  -began amaryl 1mg  daily on 11/20   -off steroids  -continue SSI for coverage CBG (last 3)  Recent Labs    08/14/19 1648 08/14/19 2124 08/15/19 0648  GLUCAP 125* 84 107*  controlled 12/11 10. H/o depression: Monitor for now--not on any meds at this time. Seems very motivated 11. HTN: Monitor BP tid--currently on metoprolol 25mg  BID    Had one episode where her systolic was 87.  Will check orthostatic BPs, given that heart rate has been running around 100 on beta-blocker would like to continue this Vitals:   08/14/19 1924 08/15/19 0545  BP: 107/65 114/84  Pulse: 91 98  Resp: 18 18  Temp: 98.8 F (37.1 C) 98.3 F (36.8 C)  SpO2: 95% 93%  Blood pressure in normal range today 12/11, HR still sl up continue current lopressor dose 25mg  BID to control HR -provigil stopped -suspect this is still related to deconditioning. Expect improvement as she becomes more physically fit 12. OSA: CPAP at nights with 2L oxygen bled in.  13. Insomnia: improved   -trazodone increased to home dose with improvement, 300mg  qhs 14. Anxiety: Xanax 0.125mg  BID PRN. Was on 1mg   BID PTA. --won't resume right now 15. HLD: Continue atorvastatin 10mg  HS. 16. Leukocytosis d/t steroids  -steroids tapered off  - wbc's 7.6 12/7   LOS: 24 days A FACE TO FACE EVALUATION WAS PERFORMED  Meredith Staggers 08/15/2019, 9:47 AM

## 2019-08-15 NOTE — Discharge Instructions (Signed)
Inpatient Rehab Discharge Instructions  [facility] Discharge date and time: 08/15/19   Activities/Precautions/ Functional Status: Activity: activity as tolerated Diet: clear liquids and diabetic diet Wound Care: none needed   Functional status:  ___ No restrictions     ___ Walk up steps independently _X__ 24/7 supervision/assistance   ___ Walk up steps with assistance ___ Intermittent supervision/assistance  ___ Bathe/dress independently ___ Walk with walker     ___ Bathe/dress with assistance ___ Walk Independently    ___ Shower independently ___ Walk with assistance    _X__ Shower with assistance _X__ No alcohol     ___ Return to work/school ________  Special Instructions: 1. Monitor blood sugars  2-3 times and record.    COMMUNITY REFERRALS UPON DISCHARGE:    Home Health:   PT     OT     RN                      Agency:  [facility]  Phone: [phone]   Medical Equipment/Items Ordered:  Wheelchair, cushion, tub bench                                                       Agency/Supplier:  [facility] @ [phone]    My questions have been answered and I understand these instructions. I will adhere to these goals and the provided educational materials after my discharge from the hospital.  Patient/Caregiver Signature _______________________________ Date __________  Clinician Signature _______________________________________ Date __________  Please bring this form and your medication list with you to all your follow-up doctor's appointments.

## 2019-08-15 NOTE — Telephone Encounter (Signed)
Lavella Lemons called to verify responsible provider for [facility] orders. I have informed them it will most likely be Dr Ranell Patrick rather than Dr Naaman Plummer, but our office will be responsible.

## 2019-08-15 NOTE — Progress Notes (Signed)
Social Work Discharge Note   The overall goal for the admission was met for:   Discharge location: Yes  Length of Stay: Yes  Discharge activity level: Yes  Home/community participation: Yes  Services provided included: MD, RD, PT, OT, RN, TR, Pharmacy and [place]: Medicare and Medicaid  Follow-up services arranged: Home Health: PT, OT via [facility], DME: 20x18 lightweight w/c, cushion, tub bench via [facility] and Patient/Family has no preference for HH/DME agencies  Comments (or additional information):    Contact info:  Pt @ [phone]  Patient/Family verbalized understanding of follow-up arrangements: Yes  Individual responsible for coordination of the follow-up plan: pt  Confirmed correct DME delivered: Arshia Rondon 08/15/2019    Suprina [place]

## 2019-08-15 NOTE — Progress Notes (Signed)
Patient discharged off of unit with all belongings. Patient understands that if symptoms recur or if the patient has any symptoms similar to those that were present at this admission, the patient needs to call their PCP. Patient and family have no further questions at time of discharge. No complications noted at this time.  [name] L [name]  

## 2019-08-17 DIAGNOSIS — I10 Essential (primary) hypertension: Secondary | ICD-10-CM

## 2019-08-17 DIAGNOSIS — E876 Hypokalemia: Secondary | ICD-10-CM

## 2019-08-17 DIAGNOSIS — J449 Chronic obstructive pulmonary disease, unspecified: Secondary | ICD-10-CM

## 2019-08-17 DIAGNOSIS — G894 Chronic pain syndrome: Secondary | ICD-10-CM

## 2019-08-17 DIAGNOSIS — E119 Type 2 diabetes mellitus without complications: Secondary | ICD-10-CM

## 2019-08-17 DIAGNOSIS — Z9989 Dependence on other enabling machines and devices: Secondary | ICD-10-CM

## 2019-08-17 DIAGNOSIS — U071 COVID-19: Secondary | ICD-10-CM

## 2019-08-17 NOTE — Discharge Summary (Signed)
Physician Discharge Summary  Patient ID: Vanessa Mack MRN: [id number] DOB/AGE: 06-03-1951 68 y.o.  Admit date: 07/22/2019 Discharge date: 08/15/2019  Discharge Diagnoses:  Principal Problem:   Physical debility Active Problems:   2019 novel coronavirus disease (COVID-19)   Diabetes (HCC)   Chronic pain syndrome   COPD (chronic obstructive pulmonary disease) (HCC)   HTN (hypertension)   Hypokalemia   OSA on CPAP   Discharged Condition: stable   Significant Diagnostic Studies: No results found.  Labs:  Basic Metabolic Panel:  BMP Latest Ref Rng & Units 08/11/2019 08/08/2019 08/04/2019  Glucose 70 - 99 mg/dL 135(H) 121(H) 174(H)  BUN 8 - 23 mg/dL 9 8 9   Creatinine 0.44 - 1.00 mg/dL 0.67 0.49 0.62  Sodium 135 - 145 mmol/L 143 143 143  Potassium 3.5 - 5.1 mmol/L 4.2 3.9 3.1(L)  Chloride 98 - 111 mmol/L 106 108 105  CO2 22 - 32 mmol/L 27 27 26   Calcium 8.9 - 10.3 mg/dL 8.6(L) 8.5(L) 8.7(L)    CBC: CBC Latest Ref Rng & Units 08/11/2019 08/08/2019 08/04/2019  WBC 4.0 - 10.5 K/uL 7.6 6.1 9.2  Hemoglobin 12.0 - 15.0 g/dL 11.1(L) 10.8(L) 12.0  Hematocrit 36.0 - 46.0 % 35.7(L) 35.3(L) 38.9  Platelets 150 - 400 K/uL 233 224 179    CBG: Recent Labs  Lab 08/14/19 0629 08/14/19 1128 08/14/19 1648 08/14/19 2124 08/15/19 0648  GLUCAP 109* 89 125* 84 107*    Brief HPI:   Vanessa Mack is a 68 y.o. female with history of T2DM, OSA, CVA, COPD, Chronic pain, GAD/depression, non-obstructive who was admitted to [facility]  09/27 to 07/03/2023 COVID-19 PNA with septic shock and respiratory failure requiring intubation.  She was treated with vancomycin and cefepime, remdesivir, Decadron, Flolan and Firazyr trial.  She had difficulty with vent wean and family declined tracheostomy.  She was ultimately weaned to 4 L oxygen per nasal cannula and discharged to [facility] with recommendations to continue 1 additional days of antibiotics for E. coli HAP.  Her respiratory status and endurance has  greatly improved.  She has been advanced to regular textures.  Her anxiety and activity tolerance is improving.  CIR was recommended due to functional decline.   Hospital Course: Vanessa Mack was admitted to rehab 07/22/2019 for inpatient therapies to consist of PT, ST and OT at least three hours five days a week. Past admission physiatrist, therapy team and rehab RN have worked together to provide customized collaborative inpatient rehab. Her respiratory status has been stable and she has been weaned off steroids. Blood pressures were monitored on TID basis and is stable on low dose metoprolol. Her blood sugars have been monitored with ac/hs CBG checks and SSI was use prn for tighter BS control.  Blood sugar control is improved with addition of Amaryl to Lantus.   Her po intake has been good and she is continent of bowel and bladder.   Reactive leukocytosis due to steroids have resolved.  Check of BMET showed persistent hypokalemia and therefore K-Dur was increased to twice daily.  She continues to have issues with chronic pain therefore lidocaine patch was added to help with shoulder pain. K pad was ordered to help with LBP. Left shoulder was injected with steroid on 12/2 with modest relief in symptoms. Sportscreme added to qid to also help with pain. She continues on Oxycodone 10 mg qid prn for pain.  Her anxiety levels have greatly improved with decreasing use of Xanax..  Modafinil was discontinued due to  intermittent tachycardia.  She has made good gains during her rehab stay and is currently at supervision level.  She will continue to receive further follow-up home health PT and OT   Rehab course: During patient's stay in rehab weekly team conferences were held to monitor patient's progress, set goals and discuss barriers to discharge. At admission, patient required max assist with mobility and max to total assist with ADL tasks. She  has had improvement in activity tolerance, balance, postural  control as well as ability to compensate for deficits.  She is able to complete ADL tasks with extra time and with supervision. She is able to perform transfers with supervision.  She is ambulating 200 feet with supervision and use of rolling walker.   Discharge disposition: 01-Home or Self Care  Diet: Heart Healthy/Carb Modified.   Special Instructions: 1. Recommend follow up BMET in a couple of weeks to monitor potassium level. 2. Follow up with pain clinic for refills and input on adjustment of oxycodone.  3. Monitor BS 2-3 times a day and record.     Discharge Instructions    Ambulatory referral to Physical Medicine Rehab   Complete by: As directed    1-2 weeks TC appt     Allergies as of 08/15/2019      Reactions   Ibuprofen Rash      Medication List    STOP taking these medications   alendronate 70 MG tablet Commonly known as: FOSAMAX   bumetanide 1 MG tablet Commonly known as: BUMEX   metFORMIN 500 MG 24 hr tablet Commonly known as: GLUCOPHAGE-XR   methotrexate (PF) 50 MG/2ML injection   omeprazole 40 MG capsule Commonly known as: PRILOSEC   oxyCODONE 15 mg 12 hr tablet Commonly known as: OXYCONTIN Replaced by: Oxycodone HCl 10 MG Tabs   tiZANidine 4 MG tablet Commonly known as: ZANAFLEX     TAKE these medications   acetaminophen 325 MG tablet Commonly known as: TYLENOL Take 1-2 tablets (325-650 mg total) by mouth every 4 (four) hours as needed for mild pain.   albuterol 108 (90 Base) MCG/ACT inhaler Commonly known as: VENTOLIN HFA Inhale 1 puff into the lungs every 6 (six) hours as needed for wheezing or shortness of breath.   ALPRAZolam 0.25 MG tablet--Rx# 10 pills Commonly known as: XANAX Take 0.5 tablets (0.125 mg total) by mouth daily as needed for anxiety. What changed:   medication strength  how much to take  when to take this  reasons to take this   atorvastatin 10 MG tablet Commonly known as: LIPITOR Take 1 tablet (10 mg total)  by mouth daily at 6 PM.   benzonatate 100 MG capsule Commonly known as: TESSALON Take 1 capsule (100 mg total) by mouth 3 (three) times daily as needed for cough.   Combivent Respimat 20-100 MCG/ACT Aers respimat Generic drug: Ipratropium-Albuterol Inhale 1 puff into the lungs 2 (two) times daily.   famotidine 20 MG tablet Commonly known as: PEPCID Take 1 tablet (20 mg total) by mouth 2 (two) times daily.   feeding supplement (PRO-STAT SUGAR FREE 64) Liqd Take 30 mLs by mouth 2 (two) times daily.   glimepiride 1 MG tablet Commonly known as: AMARYL Take 1 tablet (1 mg total) by mouth daily with breakfast.   Insulin Syringe-Needle U-100 30G X 1/2" 0.5 ML Misc 1 application by Does not apply route daily.   lidocaine 5 % Commonly known as: LIDODERM Place 2 patches onto the skin daily. Remove & Discard  patch within 12 hours or as directed by MD Notes to patient: Pick this up over the counter--apply at 8 am and remove at  8 pm daily   metoprolol tartrate 25 MG tablet Commonly known as: LOPRESSOR Take 1 tablet (25 mg total) by mouth 2 (two) times daily.   multivitamin with minerals Tabs tablet Take 1 tablet by mouth daily.   Muscle Rub 10-15 % Crea Apply 1 application topically 4 (four) times daily -  with meals and at bedtime. To back and left shoulder   Oxycodone HCl 10 MG Tabs--Rx# 20 pills Take 1 tablet (10 mg total) by mouth every 6 (six) hours as needed for severe pain. Replaces: oxyCODONE 15 mg 12 hr tablet   potassium chloride SA 20 MEQ tablet Commonly known as: KLOR-CON Take 1 tablet (20 mEq total) by mouth 2 (two) times daily.   QC Vitamin D3 50 MCG (2000 UT) Tabs Generic drug: Cholecalciferol Take 1 tablet by mouth daily.   senna-docusate 8.6-50 MG tablet Commonly known as: Senokot-S Take 2 tablets by mouth at bedtime.   traZODone 150 MG tablet Commonly known as: DESYREL Take 2 tablets (300 mg total) by mouth daily.   Tresiba 100 UNIT/ML Soln Generic  drug: Insulin Degludec Inject 30 Units into the skin daily.      Follow-up Information    Jonathon Bellows, PA-C. Call on 08/18/2019.   Specialty: Physician Assistant Why:  for post hospital follow up. (this will be your new PCP) Contact information: [address] [place] [address] [facility] Follow up.   Specialty: Physical Medicine and Rehabilitation Why: Office will call you with follow up appointment Contact information: [address] [place] [phone]          Signed: Bary Leriche 08/17/2019, 6:32 PM

## 2019-08-19 ENCOUNTER — Telehealth: Payer: Self-pay

## 2019-08-19 NOTE — Telephone Encounter (Signed)
Transitional Care call--Patient and son    1. Are you/is patient experiencing any problems since coming home? No Are there any questions regarding any aspect of care? No 2. Are there any questions regarding medications administration/dosing? No Are meds being taken as prescribed?YesPatient should review meds with caller to confirm 3. Have there been any falls? No 4. Has Home Health been to the house and/or have they contacted you? Yes If not, have you tried to contact them? Can we help you contact them? 5. Are bowels and bladder emptying properly? Yes Are there any unexpected incontinence issues? No If applicable, is patient following bowel/bladder programs? 6. Any fevers, problems with breathing, unexpected pain? No 7. Are there any skin problems or new areas of breakdown? No 8. Has the patient/family member arranged specialty MD follow up (ie cardiology/neurology/renal/surgical/etc)? Yes Can we help arrange? 9. Does the patient need any other services or support that we can help arrange? No 10. Are caregivers following through as expected in assisting the patient? Yes 11. Has the patient quit smoking, drinking alcohol, or using drugs as recommended? Yes  Appointment time 11:40 am arrive time 11:20 am with Dr. Ranell Patrick [address] [phone]

## 2019-08-27 ENCOUNTER — Encounter: Payer: Medicare Other | Admitting: Physical Medicine and Rehabilitation

## 2019-09-09 ENCOUNTER — Encounter
Payer: Medicare Other | Attending: Physical Medicine and Rehabilitation | Admitting: Physical Medicine and Rehabilitation

## 2019-09-09 ENCOUNTER — Other Ambulatory Visit: Payer: Self-pay | Admitting: *Deleted

## 2019-09-09 ENCOUNTER — Other Ambulatory Visit: Payer: Self-pay

## 2019-09-09 ENCOUNTER — Encounter: Payer: Self-pay | Admitting: Physical Medicine and Rehabilitation

## 2019-09-09 VITALS — Ht 67.0 in | Wt 220.0 lb

## 2019-09-09 DIAGNOSIS — U071 COVID-19: Secondary | ICD-10-CM | POA: Diagnosis not present

## 2019-09-09 DIAGNOSIS — J449 Chronic obstructive pulmonary disease, unspecified: Secondary | ICD-10-CM

## 2019-09-09 MED ORDER — ATORVASTATIN CALCIUM 10 MG PO TABS: 10.0000 mg | ORAL_TABLET | Freq: Every day | ORAL | 0 refills | Status: AC

## 2019-09-09 MED ORDER — SENNOSIDES-DOCUSATE SODIUM 8.6-50 MG PO TABS: 2.0000 | ORAL_TABLET | Freq: Every day | ORAL | 0 refills | Status: DC

## 2019-09-09 MED ORDER — SENNOSIDES-DOCUSATE SODIUM 8.6-50 MG PO TABS: 2.0000 | ORAL_TABLET | Freq: Every day | ORAL | 0 refills | Status: AC

## 2019-09-09 MED ORDER — METOPROLOL TARTRATE 25 MG PO TABS: 25.0000 mg | ORAL_TABLET | Freq: Two times a day (BID) | ORAL | 0 refills | Status: AC

## 2019-09-09 NOTE — Progress Notes (Signed)
Subjective:    Patient ID: Vanessa Mack, female    DOB: 1951-02-03, 69 y.o.   MRN: [id number]  HPI  Due to national recommendations of social distancing because of COVID 42, an audio/video tele-health visit is felt to be the most appropriate encounter for this patient at this time. See MyChart message from today for the patient's consent to a tele-health encounter with [facility]. This is a follow up tele-visit via phone. The patient is at home. MD is at office.   Vanessa Mack is a 69 year-old woman presenting for follow-up after CIR admission for pneumonia 2/2 COVID-19. She says every day is a little better. She is getting home PT and OT and it has been helpful. She has no concerns. She needs a refill on her stool softener, lipitor, and blood pressure pills. She has enough insulin. She has an appointment scheduled with her PCP. She is not requiring oxygen anymore.   Pain Inventory Average Pain 9 Pain Right Now 5 My pain is intermittent, constant, sharp, burning, dull, stabbing, tingling and aching  In the last 24 hours, has pain interfered with the following? General activity 10 Relation with others 10 Enjoyment of life 10 What TIME of day is your pain at its worst? all Sleep (in general) Fair  Pain is worse with: walking, bending, sitting, inactivity, standing and some activites Pain improves with: medication Relief from Meds: 8  Mobility walk without assistance walk with assistance use a walker  Function I need assistance with the following:  dressing, bathing, meal prep, household duties and shopping  Neuro/Psych bladder control problems weakness numbness tremor tingling trouble walking spasms anxiety  Prior Studies Any changes since last visit?  no  Physicians involved in your care Any changes since last visit?  no  Sees her PCP 09/15/19, and her cardiologist 10/01/19   Family History  Problem Relation Age of Onset  .  Diabetes Mother   . Skin cancer Mother   . Clotting disorder Mother   . Psoriasis Father    Social History   Socioeconomic History  . Marital status: Single    Spouse name: Not on file  . Number of children: Not on file  . Years of education: Not on file  . Highest education level: Not on file  Occupational History  . Not on file  Tobacco Use  . Smoking status: Unknown If Ever Smoked  . Smokeless tobacco: Never Used  Substance and Sexual Activity  . Alcohol use: Not on file  . Drug use: Not on file  . Sexual activity: Not on file  Other Topics Concern  . Not on file  Social History Narrative  . Not on file   Social Determinants of Health   Financial Resource Strain:   . Difficulty of Paying Living Expenses: Not on file  Food Insecurity:   . Worried About Charity fundraiser in the Last Year: Not on file  . Ran Out of Food in the Last Year: Not on file  Transportation Needs:   . Lack of Transportation (Medical): Not on file  . Lack of Transportation (Non-Medical): Not on file  Physical Activity:   . Days of Exercise per Week: Not on file  . Minutes of Exercise per Session: Not on file  Stress:   . Feeling of Stress : Not on file  Social Connections:   . Frequency of Communication with Friends and Family: Not on file  . Frequency of Social  Gatherings with Friends and Family: Not on file  . Attends Religious Services: Not on file  . Active Member of Clubs or Organizations: Not on file  . Attends Archivist Meetings: Not on file  . Marital Status: Not on file   Past Surgical History:  Procedure Laterality Date  . EXPLORATORY LAPAROTOMY    . TOTAL KNEE ARTHROPLASTY Right 09/2017   Past Medical History:  Diagnosis Date  . 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 Chronic respiratory failure with hypoxia ([place])   . Colon adenomas   . COPD (chronic obstructive pulmonary disease) ([place])   . Cough with hemoptysis 10/2018  . COVID-19 05/31/2019  . Diverticulosis    . DOE (dyspnea on exertion)   . Fibromyalgia   . GAD (generalized anxiety disorder)   . Glaucoma   . Hematuria   . HTN (hypertension)   . Impaired functional mobility, balance, gait, and endurance   . Intrinsic asthma   . Lumbar radiculopathy   . Major depressive disorder, recurrent episode, moderate ([place])   . Nonobstructive atherosclerosis of coronary artery   . OSA (obstructive sleep apnea)   . Polyneuropathy   . Prolonged Q-T interval on ECG   . Psoriatic arthritis ([place])   . PUD (peptic ulcer disease)   . Sepsis due to COVID-19 ([place]) 05/2019  . Stroke (cerebrum) ([place])   . T2DM (type 2 diabetes mellitus) ([place])   . Venous stasis of lower extremity    Ht 5\' 7"  (1.702 m) Comment: last recorded  Wt 220 lb (99.8 kg) Comment: last recorded  LMP  (LMP Unknown)   BMI 34.46 kg/m   Opioid Risk Score:   Fall Risk Score:  `1  Depression screen PHQ 2/9  No flowsheet data found.  Review of Systems  Constitutional: Negative.   HENT: Negative.   Eyes: Negative.   Respiratory: Negative.   Cardiovascular: Negative.   Gastrointestinal: Negative.   Endocrine: Negative.   Genitourinary: Positive for frequency.  Musculoskeletal: Positive for arthralgias, back pain, gait problem, myalgias and neck pain.       Knee pain  Skin: Negative.   Allergic/Immunologic: Negative.   Neurological: Positive for tremors, weakness and numbness.       Tingling  Hematological: Negative.   Psychiatric/Behavioral: The patient is nervous/anxious.   All other systems reviewed and are negative.      Objective:   Physical Exam  Not performed since patient was seen via phone      Assessment & Plan:  Vanessa Mack is a 69 year-old woman presenting for follow-up after CIR admission for pneumonia 2/2 COVID-19  --Continue home PT and OT --Progressing very well! No longer requiring oxygen.  --Provided refills of stool softener, metoprolol, and lipitor. She has follow-up appointment with PT scheduled.     15 minutes of face to face patient care time were spent during this visit. All questions were encouraged and answered.  Follow up with me PRN

## 2019-11-13 ENCOUNTER — Other Ambulatory Visit: Payer: Self-pay | Admitting: Physical Medicine and Rehabilitation

## 2020-03-02 ENCOUNTER — Other Ambulatory Visit: Payer: Self-pay | Admitting: Physical Medicine and Rehabilitation

## 2020-04-05 ENCOUNTER — Other Ambulatory Visit: Payer: Self-pay | Admitting: Physical Medicine and Rehabilitation

## 2020-05-18 ENCOUNTER — Other Ambulatory Visit: Payer: Self-pay | Admitting: Physical Medicine and Rehabilitation

## 2021-03-12 IMAGING — DX DG CHEST 1V PORT
1 series · 1 of 1 positions shown · non-contrast
Comparison: 06/01/2019 chest CT

CLINICAL DATA: Respiratory failure

EXAM:
PORTABLE CHEST 1 VIEW

[chest]
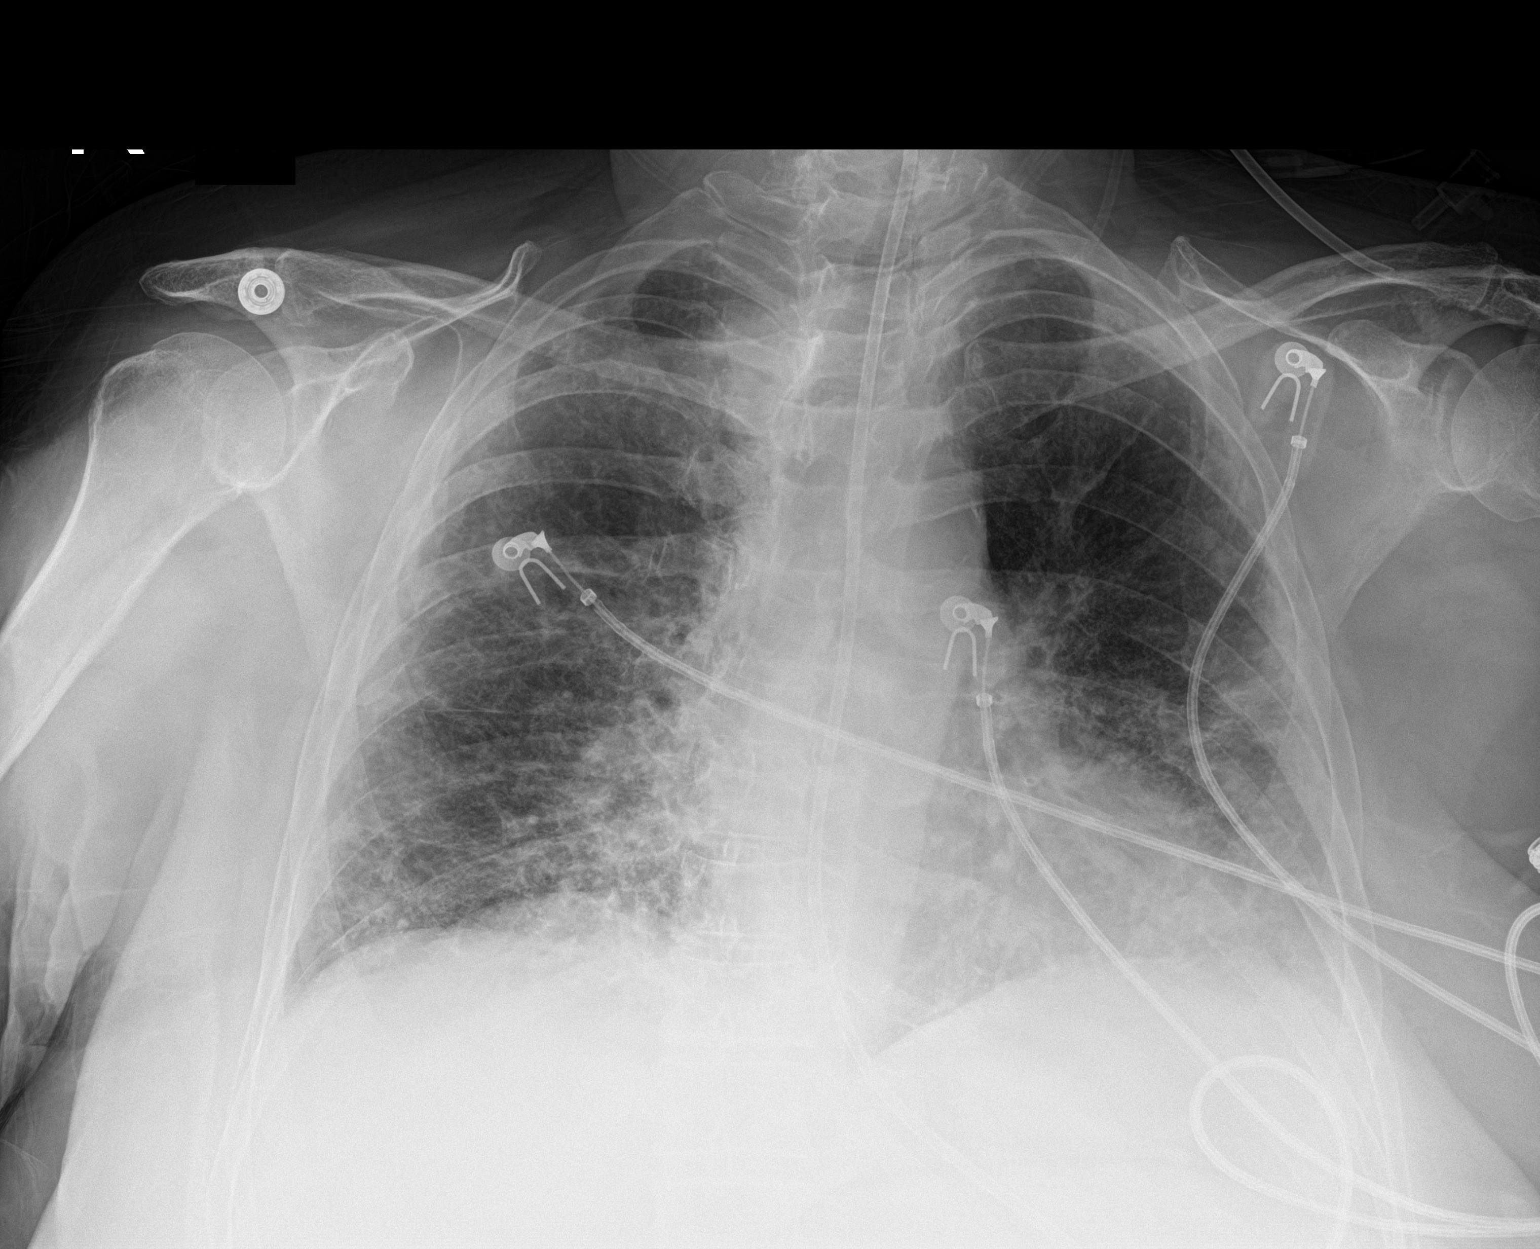

[1 of 1 positions shown; findings below may reference images not displayed]

FINDINGS: Patchy bilateral airspace disease with interstitial coarsening.
There is improved aeration compared to CT scanogram, especially at
the left base. Feeding tube at least reaches the stomach. No
effusion or pneumothorax. Normal heart size for portable technique.
IMPRESSION: Bilateral subpleural airspace disease with mild improvement from
chest CT 06/01/2019.

## 2021-03-13 IMAGING — DX DG CHEST 1V PORT
1 series · 1 of 1 positions shown · non-contrast
Comparison: 07/04/2019

CLINICAL DATA: Respiratory failure

EXAM:
PORTABLE CHEST 1 VIEW

[chest ap]
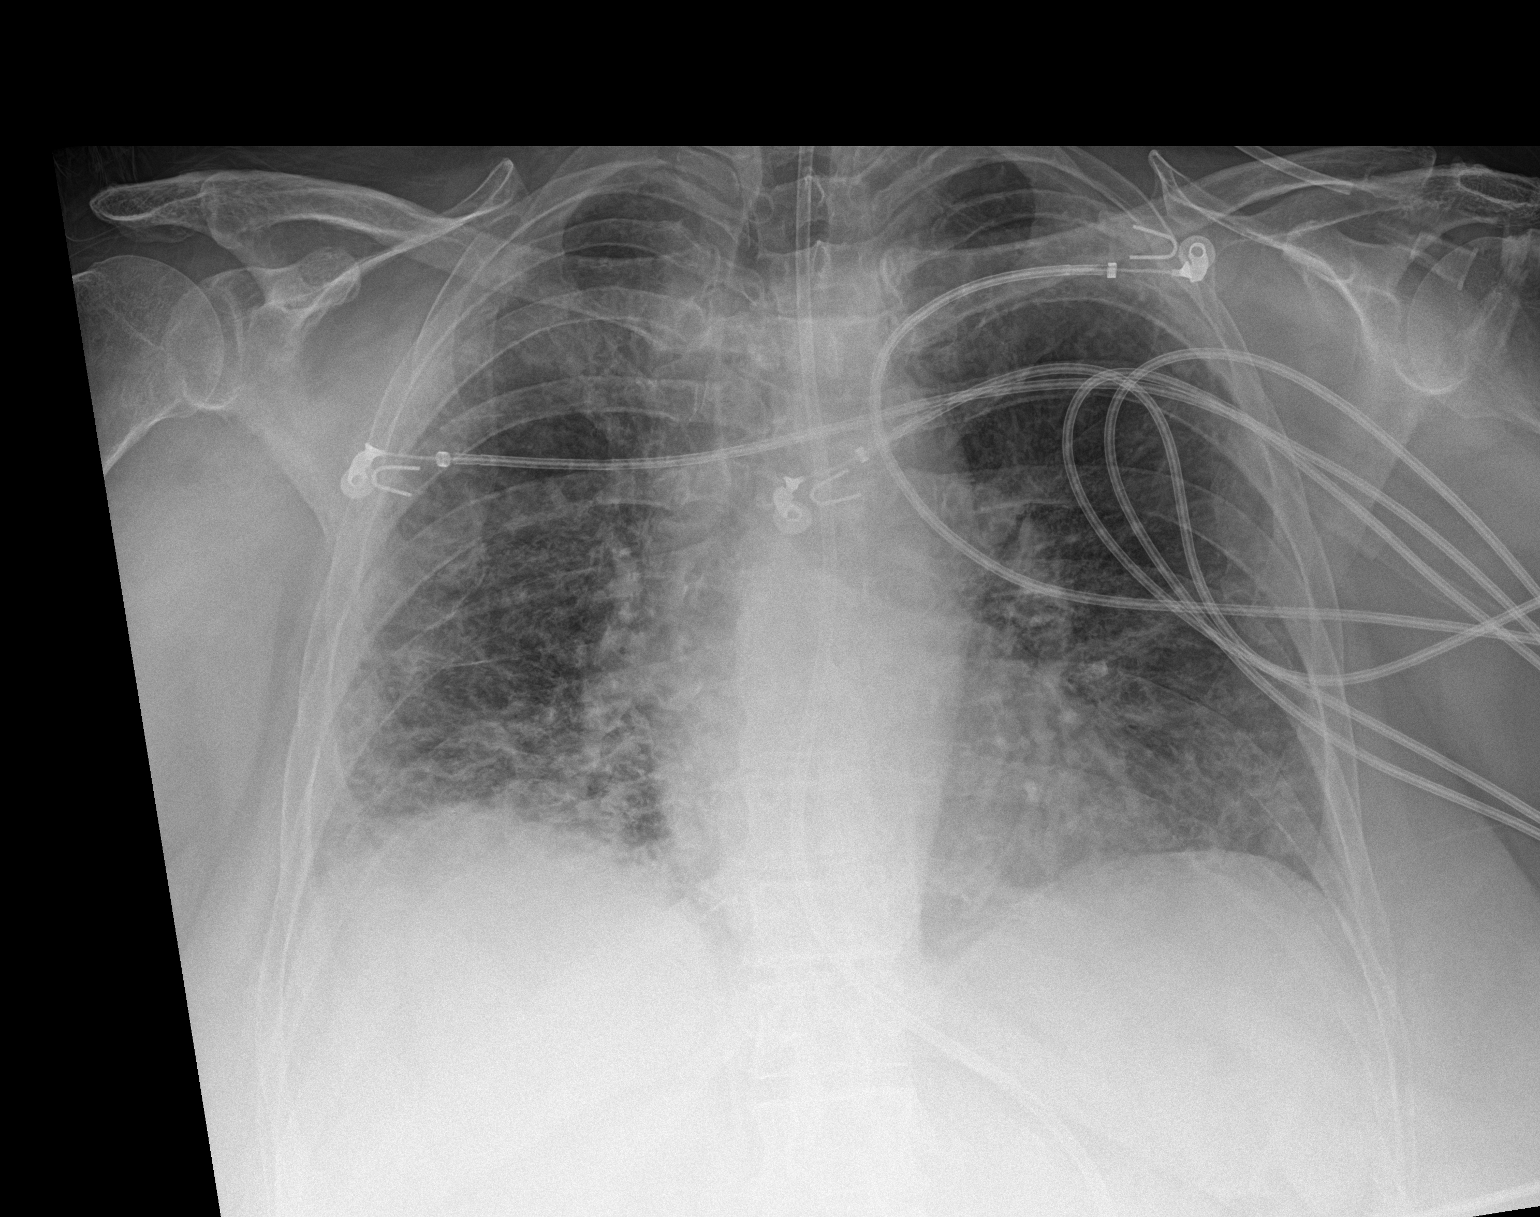

[1 of 1 positions shown; findings below may reference images not displayed]

FINDINGS: No significant interval change in AP portable examination with
subpleural and basilar predominant heterogeneous and interstitial
opacity. There is no new airspace opacity. There may be chronic
underlying interstitial change. The heart and mediastinum are
unremarkable. Partially imaged enteric feeding tube.
IMPRESSION: No significant interval change in AP portable examination with
subpleural and basilar predominant heterogeneous and interstitial
opacity, consistent with multifocal infection. There is no new
airspace opacity. There may be chronic underlying interstitial
change.
